# Patient Record
Sex: Female | Born: 1995 | Hispanic: Yes | Marital: Married | Smoking: Never smoker
Health system: Southern US, Community
[De-identification: ages and names within clinical notes are randomized; demographics above are authoritative.]

## PROBLEM LIST (undated history)

## (undated) ENCOUNTER — Inpatient Hospital Stay (HOSPITAL_COMMUNITY): Payer: Self-pay

## (undated) DIAGNOSIS — B009 Herpesviral infection, unspecified: Secondary | ICD-10-CM

## (undated) DIAGNOSIS — Z789 Other specified health status: Secondary | ICD-10-CM

## (undated) DIAGNOSIS — D649 Anemia, unspecified: Secondary | ICD-10-CM

## (undated) HISTORY — PX: NO PAST SURGERIES: SHX2092

---
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
## 2013-06-03 ENCOUNTER — Encounter (HOSPITAL_COMMUNITY): Payer: Self-pay | Admitting: Emergency Medicine

## 2013-06-03 ENCOUNTER — Emergency Department (INDEPENDENT_AMBULATORY_CARE_PROVIDER_SITE_OTHER)
Admission: EM | Admit: 2013-06-03 | Discharge: 2013-06-03 | Disposition: A | Discharge status: Home / Self Care | Payer: Medicaid Other | Source: Home / Self Care

## 2013-06-03 DIAGNOSIS — N926 Irregular menstruation, unspecified: Secondary | ICD-10-CM

## 2013-06-03 DIAGNOSIS — N921 Excessive and frequent menstruation with irregular cycle: Secondary | ICD-10-CM

## 2013-06-03 LAB — POCT URINALYSIS DIP (DEVICE)
BILIRUBIN URINE: NEGATIVE
GLUCOSE, UA: NEGATIVE mg/dL
Ketones, ur: NEGATIVE mg/dL
LEUKOCYTES UA: NEGATIVE
NITRITE: NEGATIVE
Protein, ur: 100 mg/dL — AB
Specific Gravity, Urine: 1.025 (ref 1.005–1.030)
UROBILINOGEN UA: 0.2 mg/dL (ref 0.0–1.0)
pH: 6 (ref 5.0–8.0)

## 2013-06-03 LAB — POCT PREGNANCY, URINE: PREG TEST UR: NEGATIVE

## 2013-06-03 MED ORDER — NORETHINDRONE-ETH ESTRADIOL 1-35 MG-MCG PO TABS: ORAL_TABLET | ORAL | Status: DC

## 2013-06-03 NOTE — ED Provider Notes (Signed)
Medical screening examination/treatment/procedure(s) were performed by resident physician or non-physician practitioner and as supervising physician I was immediately available for consultation/collaboration.   Timithy Arons DOUGLAS MD.   Jb Dulworth D Leiland Mihelich, MD 06/03/13 2056 

## 2013-06-03 NOTE — ED Provider Notes (Signed)
CSN: [id number]     Arrival date & time 06/03/13  1331 History   First MD Initiated Contact with Patient 06/03/13 1517     Chief Complaint  Patient presents with  . Vaginal Bleeding   (Consider location/radiation/quality/duration/timing/severity/associated sxs/prior Treatment) HPI Comments: 18 year old female is complaining of irregular menses for 9 years. Since the onset of menarche she has had irregular menses and dysmenorrhea. She comes in today to the urgent care because she has been bleeding for the past 10 days. She states she has never been regular and often will skip a month or 2 months before it returns. Her usual flow averages 4 days sometimes longer. Other than dysmenorrhea she denies pelvic pain. No fever or vomiting.   Past Medical History  Diagnosis Date  . Medical history non-contributory    Past Surgical History  Procedure Laterality Date  . No past surgeries    . Wound exploration Right 09/05/2012    Procedure: WOUND EXPLORATION OF RIGHT WRIST;  Surgeon: Sharma CovertFred W Ortmann, MD;  Location: [facility] OR;  Service: Orthopedics;  Laterality: Right;  . Tendon repair Right 09/05/2012    Procedure: NERVE AND TENDON REPAIR;  Surgeon: Sharma CovertFred W Ortmann, MD;  Location: [facility] OR;  Service: Orthopedics;  Laterality: Right;   History reviewed. No pertinent family history. History  Substance Use Topics  . Smoking status: Never Smoker   . Smokeless tobacco: Not on file  . Alcohol Use: No   OB History   Grav Para Term Preterm Abortions TAB SAB Ect Mult Living                 Review of Systems  Constitutional: Negative.   Respiratory: Negative.   Gastrointestinal: Negative.   Genitourinary: Positive for vaginal bleeding and menstrual problem. Negative for dysuria, vaginal discharge and vaginal pain.  Neurological: Negative.     Allergies  Review of patient's allergies indicates no known allergies.  Home Medications   Current Outpatient Rx  Name  Route  Sig  Dispense  Refill  .  norethindrone-ethinyl estradiol 1/35 (ORTHO-NOVUM 1/35, 28,) tablet      One tab bid for 10 days then 1 q d   1 Package   0    BP 120/70  Pulse 72  Temp(Src) 98.6 F (37 C) (Oral)  Resp 18  SpO2 100%  LMP 05/24/2013 Physical Exam  Nursing note and vitals reviewed. Constitutional: She is oriented to person, place, and time. She appears well-developed and well-nourished. No distress.  Eyes: Conjunctivae and EOM are normal.  Neck: Normal range of motion. Neck supple.  Cardiovascular: Normal rate and regular rhythm.   Pulmonary/Chest: Effort normal and breath sounds normal. No respiratory distress.  Abdominal: Soft. Bowel sounds are normal. She exhibits no distension and no mass. There is no tenderness. There is no rebound and no guarding.  Neurological: She is alert and oriented to person, place, and time. She exhibits normal muscle tone.  Skin: Skin is warm and dry.  Psychiatric: She has a normal mood and affect.    ED Course  Procedures (including critical care time) Labs Review Labs Reviewed  POCT URINALYSIS DIP (DEVICE) - Abnormal; Notable for the following:    Hgb urine dipstick MODERATE (*)    Protein, ur 100 (*)    All other components within normal limits  POCT PREGNANCY, URINE   Imaging Review No results found.    MDM   1. Metrorrhagia   2. Irregular menses    Must followup with PCP for  continuation of evaluation and management. Today we will start Ortho-Novum 1/35 one twice a day for 10 days and one daily.    Hayden Rasmussen, NP 06/03/13 [phone]

## 2013-06-03 NOTE — ED Notes (Signed)
Pt reports  irreg  And  Prolonged  Vaginal  Bleeding      -  Pt  denys  Any pain            Ambulated  With a  Slow  Steady  Gait     Appears  In no  Severe  Distress

## 2013-06-03 NOTE — Discharge Instructions (Signed)
Abnormal Uterine Bleeding Abnormal uterine bleeding can affect women at various stages in life, including teenagers, women in their reproductive years, pregnant women, and women who have reached menopause. Several kinds of uterine bleeding are considered abnormal, including:  Bleeding or spotting between periods.   Bleeding after sexual intercourse.   Bleeding that is heavier or more than normal.   Periods that last longer than usual.  Bleeding after menopause.  Many cases of abnormal uterine bleeding are minor and simple to treat, while others are more serious. Any type of abnormal bleeding should be evaluated by your health care provider. Treatment will depend on the cause of the bleeding. HOME CARE INSTRUCTIONS Monitor your condition for any changes. The following actions may help to alleviate any discomfort you are experiencing:  Avoid the use of tampons and douches as directed by your health care provider.  Change your pads frequently. You should get regular pelvic exams and Pap tests. Keep all follow-up appointments for diagnostic tests as directed by your health care provider.  SEEK MEDICAL CARE IF:   Your bleeding lasts more than 1 week.   You feel dizzy at times.  SEEK IMMEDIATE MEDICAL CARE IF:   You pass out.   You are changing pads every 15 to 30 minutes.   You have abdominal pain.  You have a fever.   You become sweaty or weak.   You are passing large blood clots from the vagina.   You start to feel nauseous and vomit. MAKE SURE YOU:   Understand these instructions.  Will watch your condition.  Will get help right away if you are not doing well or get worse. Document Released: 04/18/2005 Document Revised: 12/19/2012 Document Reviewed: 11/15/2012 [facility] Patient Information 2014 [place], [place].  Menstruation Menstruation is the monthly passing of blood, tissue, fluid and mucus, also know as a period. Your body is shedding the lining of the  uterus. The flow, or amount of blood, usually lasts from 3 7 days each month. Hormones control the menstrual cycle. Hormones are a chemical substance produced by endocrine glands in the body to regulate different bodily functions. The first menstrual period may start any time between age 18 years to 16 years. However, it usually starts around age 35 years. Some girls have regular monthly menstrual cycles right from the beginning. However, it is not unusual to have only a couple of drops of blood or spotting when you first start menstruating. It is also not unusual to have two periods a month or miss a month or two when first starting your periods. SYMPTOMS   Mild to moderate abdominal cramps.  Aching or pain in the lower back area. Symptoms may occur 5 10 days before your menstrual period starts. These symptoms are referred to as premenstrual syndrome (PMS). These symptoms can include:  Headache.  Breast tenderness and swelling.  Bloating.  Tiredness (fatigue).  Mood changes.  Craving for certain foods. These are normal signs and symptoms and can vary in severity. To help relieve these problems, ask your caregiver if you can take over-the-counter medications for pain or discomfort. If the symptoms are not controllable, see your caregiver for help.  HORMONES INVOLVED IN MENSTRUATION Menstruation comes about because of hormones produced by the pituitary gland in the brain and the ovaries that affect the uterine lining. First, the pituitary gland in the brain produces the hormone follicle stimulating hormone [facility]). FSH stimulates the ovaries to produce estrogen, which thickens the uterine lining and begins to develop an  egg in the ovary. About 14 days later, the pituitary gland produces another hormone called luteinizing hormone (LH). LH causes the egg to come out of a sac in the ovary (ovulation). The empty sac on the ovary called the corpus luteum is stimulated by another hormone from the  pituitary gland called luteotropin. The corpus luteum begins to produce the estrogen and progesterone hormone. The progesterone hormone prepares the lining of the uterus to have the fertilized egg (egg combined with sperm) attach to the lining of the uterus and begin to develop into a fetus. If the egg is not fertilized, the corpus luteum stops producing estrogen and progesterone, it disappears, the lining of the uterus sloughs off and a menstrual period begins. Then the menstrual cycle starts all over again and will continue monthly unless pregnancy occurs or menopause begins. The secretion of hormones is complex. Various parts of the body become involved in many chemical activities. Female sex hormones have other functions in a woman's body as well. Estrogen increases a woman's sex drive (libido). It naturally helps body get rid of fluids (diuretic). It also aids in the process of building new bone. Therefore, maintaining hormonal health is essential to all levels of a woman's well being. These hormones are usually present in normal amounts and cause you to menstruate. It is the relationship between the (small) levels of the hormones that is critical. When the balance is upset, menstrual irregularities can occur. HOW DOES THE MENSTRUAL CYCLE HAPPEN?  Menstrual cycles vary in length from 21 35 days with an average of 29 days. The cycle begins on the first day of bleeding. At this time, the pituitary gland in the brain releases FSH that travels through the bloodstream to the ovaries. [facility] stimulates the follicles in the ovaries. This prepares the body for ovulation that occurs around the 14th day of the cycle. The ovaries produce estrogen, and this makes sure conditions are right in the uterus for implantation of the fertilized egg.  When the levels of estrogen reach a high enough level, it signals the gland in the brain (pituitary gland) to release a surge of LH. This causes the release of the ripest egg  from its follicle (ovulation). Usually only one follicle releases one egg, but sometimes more than one follicle releases an egg especially when stimulating the ovaries for in vitro fertilization. The egg can then be collected by either fallopian tube to await fertilization. The burst follicle within the ovary that is left behind is now called the corpus luteum or "yellow body." The corpus luteum continues to give off (secrete) reduced amounts of estrogen. This closes and hardens the cervix. It dries up the mucus to the naturally infertile condition.  The corpus luteum also begins to give off greater amounts of progesterone. This causes the lining of the uterus (endometrium) to thicken even more in preparation for the fertilized egg. The egg is starting to journey down from the fallopian tube to the uterus. It also signals the ovaries to stop releasing eggs. It assists in returning the cervical mucus to its infertile state.  If the egg implants successfully into the womb lining and pregnancy occurs, progesterone levels will continue to raise. It is often this hormone that gives some pregnant women a feeling of well being, like a "natural high." Progesterone levels drop again after childbirth.  If fertilization does not occur, the corpus luteum dies, stopping the production of hormones. This sudden drop in progesterone causes the uterine lining to break  down, accompanied by blood (menstruation).  This starts the cycle back at day 1. The whole process starts all over again. Woman go through this cycle every month from puberty to menopause. Women have breaks only for pregnancy and breastfeeding (lactation), unless the woman has health problems that affect the female hormone system or chooses to use oral contraceptives to have unnatural menstrual periods. HOME CARE INSTRUCTIONS   Keep track of your periods by using a calendar.  If you use tampons, get the least absorbent to avoid toxic shock syndrome.  Do  not leave tampons in the vagina over night or longer than 6 hours.  Wear a sanitary pad over night.  Exercise 3 5 times a week or more.  Avoid foods and drinks that you know will make your symptoms worse before or during your period. SEEK MEDICAL CARE IF:   You develop a fever with your period.  Your periods are lasting more than 7 days.  Your period is so heavy that you have to change pads or tampons every 30 minutes.  You develop clots with your period and never had clots before.  You cannot get relief from over-the-counter medication for your symptoms.  Your period has not started, and it has been longer than 35 days. Document Released: 04/08/2002 Document Revised: 02/06/2013 Document Reviewed: 11/15/2012 [facility] Patient Information 2014 [place], [place].  Metrorrhagia Metrorrhagia is bleeding from your uterus. It happens at times when you are not expecting it, like between periods. The bleeding may also last a long time. HOME CARE   Take all medicine as told by your doctor. Do not change or switch medicines without talking to your doctor first.  Take iron pills (supplements) as told by your doctor. If you start to have trouble pooping (bowel movement), eat more fruits and vegetables.  Do not take aspirin before or during your period. Do not take medicines that have aspirin in them. Check the label.  Rest as much as you can if you change a pad or tampon more than once in 2 hours.  Eat meals that include foods that have iron in them. These foods are:  Green leafy vegetables.  Red meat.  Liver.  Eggs.  Whole-grain breads and cereals.  Do not try to lose weight until your doctor says it is okay. GET HELP RIGHT AWAY IF:  You have a fever.  You have chills.  You feel lightheaded or pass out (faint).  You need to change your pad or tampon more than once in 1 hour.  Your bleeding is heavy.  You pass clumps of blood (clots) or tissue from your vagina.  You  feel sick to your stomach (nauseous) and throw up (vomit).  You cannot keep foods down.  You feel dizzy or have watery poop (diarrhea) while taking medicine.  You have problems that you think are caused by your medicines. MAKE SURE YOU:   Understand these instructions.  Will watch your condition.  Will get help right away if you are not doing well or get worse. Document Released: 07/11/2011 Document Reviewed: 07/11/2011 [facility] Patient Information 2014 [place], [place].

## 2013-08-27 ENCOUNTER — Emergency Department: Payer: Self-pay | Admitting: Emergency Medicine

## 2013-08-27 LAB — COMPREHENSIVE METABOLIC PANEL
ALBUMIN: 3.8 g/dL (ref 3.8–5.6)
ALK PHOS: 66 U/L
ALT: 21 U/L (ref 12–78)
BUN: 10 mg/dL (ref 7–18)
Bilirubin,Total: 0.4 mg/dL (ref 0.2–1.0)
Calcium, Total: 9.3 mg/dL (ref 9.0–10.7)
Chloride: 105 mmol/L (ref 98–107)
Co2: 25 mmol/L (ref 21–32)
Creatinine: 0.72 mg/dL (ref 0.60–1.30)
Glucose: 95 mg/dL (ref 65–99)
Potassium: 3.5 mmol/L (ref 3.5–5.1)
SGOT(AST): 14 U/L (ref 0–26)
SODIUM: 139 mmol/L (ref 136–145)
TOTAL PROTEIN: 7.6 g/dL (ref 6.4–8.6)

## 2013-08-27 LAB — ACETAMINOPHEN LEVEL

## 2013-08-27 LAB — URINALYSIS, COMPLETE
BACTERIA: NONE SEEN
GLUCOSE, UR: NEGATIVE mg/dL (ref 0–75)
KETONE: NEGATIVE
LEUKOCYTE ESTERASE: NEGATIVE
NITRITE: NEGATIVE
PH: 5 (ref 4.5–8.0)
Protein: 30
SPECIFIC GRAVITY: 1.03 (ref 1.003–1.030)
Squamous Epithelial: <1
WBC UR: 2 /HPF (ref 0–5)

## 2013-08-27 LAB — CBC
HCT: 34 % — AB (ref 35.0–47.0)
HGB: 11.2 g/dL — ABNORMAL LOW (ref 12.0–16.0)
MCH: 27.5 pg (ref 26.0–34.0)
MCHC: 32.9 g/dL (ref 32.0–36.0)
MCV: 84 fL (ref 80–100)
Platelet: 319 10*3/uL (ref 150–440)
RBC: 4.06 10*6/uL (ref 3.80–5.20)
RDW: 13.5 % (ref 11.5–14.5)
WBC: 15.4 10*3/uL — ABNORMAL HIGH (ref 3.6–11.0)

## 2013-08-27 LAB — DRUG SCREEN, URINE
Amphetamines, Ur Screen: NEGATIVE (ref ?–1000)
Barbiturates, Ur Screen: NEGATIVE (ref ?–200)
Benzodiazepine, Ur Scrn: NEGATIVE (ref ?–200)
COCAINE METABOLITE, UR ~~LOC~~: NEGATIVE (ref ?–300)
Cannabinoid 50 Ng, Ur ~~LOC~~: NEGATIVE (ref ?–50)
MDMA (ECSTASY) UR SCREEN: NEGATIVE (ref ?–500)
METHADONE, UR SCREEN: NEGATIVE (ref ?–300)
Opiate, Ur Screen: NEGATIVE (ref ?–300)
Phencyclidine (PCP) Ur S: NEGATIVE (ref ?–25)
TRICYCLIC, UR SCREEN: NEGATIVE (ref ?–1000)

## 2013-08-27 LAB — ETHANOL

## 2013-08-27 LAB — PREGNANCY, URINE: Pregnancy Test, Urine: NEGATIVE m[IU]/mL

## 2013-08-27 LAB — SALICYLATE LEVEL: Salicylates, Serum: <1.7 mg/dL

## 2015-02-06 ENCOUNTER — Ambulatory Visit (INDEPENDENT_AMBULATORY_CARE_PROVIDER_SITE_OTHER): Payer: Medicaid Other | Admitting: Internal Medicine

## 2015-02-06 DIAGNOSIS — Z789 Other specified health status: Secondary | ICD-10-CM

## 2015-02-06 DIAGNOSIS — Z23 Encounter for immunization: Secondary | ICD-10-CM | POA: Diagnosis not present

## 2015-02-06 DIAGNOSIS — Z7189 Other specified counseling: Secondary | ICD-10-CM | POA: Diagnosis present

## 2015-02-06 DIAGNOSIS — Z7184 Encounter for health counseling related to travel: Secondary | ICD-10-CM | POA: Insufficient documentation

## 2015-02-06 MED ORDER — AZITHROMYCIN 500 MG PO TABS: 1000.0000 mg | ORAL_TABLET | Freq: Once | ORAL | Status: DC

## 2015-02-06 MED ORDER — ATOVAQUONE-PROGUANIL HCL 250-100 MG PO TABS: 1.0000 | ORAL_TABLET | Freq: Every day | ORAL | Status: DC

## 2015-02-06 NOTE — Progress Notes (Signed)
Patient will come back 11/7 for Bexsero #2 and Typhim Vi injections.  She will return in 6 months for Hepatitis A #2 injection.  This will complete her series. Andree Coss, RN

## 2015-02-06 NOTE — Progress Notes (Signed)
Patient ID: Bridget Bray, female   DOB: [birth year], 19 y.o.   MRN: [id number] Subjective:   Bridget Bray is a 19 y.o. female who presents to the Infectious Disease clinic for travel consultation. Planned departure date: May 19, 2015          Planned return date: 4 months Countries of travel: none, none, [place], [place], [place], [place] and [place], [place] and [place] and [place] Areas in country: on a ship, semester at sea with port stops for several days   Accommodations: hotel and ship Purpose of travel: foreign study Prior travel out of [place]: yes     Objective:   Medications:reviewed    Assessment:    No contraindications to travel. none     Plan:    Issues discussed: altitude illness, insect-borne illnesses, Japanese encephalitis, malaria, MVA safety, rabies, safe food/water, traveler's diarrhea, website/handouts for more information, what to do if ill upon return, what to do if ill while there and Yellow Fever. Immunizations recommended: Hepatitis A series, Meningococcus, Typhoid (parenteral) and Yellow Fever. Malaria prophylaxis: malarone, daily dose starting 1-2 days before entering endemic area, ending 7 days after leaving area Traveler's diarrhea prophylaxis: azithromycin. Total duration of visit: 1 Hour. Total time spent on education, counseling, coordination of care: 30 Minutes Reviewed travel itinerary.

## 2015-03-09 ENCOUNTER — Ambulatory Visit: Payer: Medicaid Other

## 2015-04-08 ENCOUNTER — Ambulatory Visit: Payer: Medicaid Other

## 2016-04-18 ENCOUNTER — Encounter (HOSPITAL_COMMUNITY): Payer: Self-pay | Admitting: *Deleted

## 2017-06-21 ENCOUNTER — Encounter: Payer: Self-pay | Admitting: *Deleted

## 2017-06-21 ENCOUNTER — Ambulatory Visit (INDEPENDENT_AMBULATORY_CARE_PROVIDER_SITE_OTHER): Payer: Self-pay | Admitting: *Deleted

## 2017-06-21 DIAGNOSIS — Z3201 Encounter for pregnancy test, result positive: Secondary | ICD-10-CM

## 2017-06-21 DIAGNOSIS — Z3200 Encounter for pregnancy test, result unknown: Secondary | ICD-10-CM

## 2017-06-21 LAB — POCT PREGNANCY, URINE: Preg Test, Ur: POSITIVE — AB

## 2017-06-21 NOTE — Progress Notes (Signed)
Here for urine pregnancy test which was positive. States LMP was 05/13/17. Which makes her [id number]. And EDD 02/17/18.  Not taking any meds now. Encouraged  To start prenatal care asap. Given letter of proof of pregnancy.

## 2017-07-25 ENCOUNTER — Ambulatory Visit (INDEPENDENT_AMBULATORY_CARE_PROVIDER_SITE_OTHER): Payer: BLUE CROSS/BLUE SHIELD | Admitting: Certified Nurse Midwife

## 2017-07-25 ENCOUNTER — Encounter: Payer: Self-pay | Admitting: Certified Nurse Midwife

## 2017-07-25 VITALS — BP 108/78 | HR 90 | Wt 142.0 lb

## 2017-07-25 DIAGNOSIS — Z3400 Encounter for supervision of normal first pregnancy, unspecified trimester: Secondary | ICD-10-CM | POA: Diagnosis not present

## 2017-07-25 DIAGNOSIS — Z124 Encounter for screening for malignant neoplasm of cervix: Secondary | ICD-10-CM

## 2017-07-25 DIAGNOSIS — Z113 Encounter for screening for infections with a predominantly sexual mode of transmission: Secondary | ICD-10-CM | POA: Diagnosis not present

## 2017-07-25 DIAGNOSIS — Z1151 Encounter for screening for human papillomavirus (HPV): Secondary | ICD-10-CM

## 2017-07-25 DIAGNOSIS — Z3401 Encounter for supervision of normal first pregnancy, first trimester: Secondary | ICD-10-CM

## 2017-07-25 DIAGNOSIS — Z3481 Encounter for supervision of other normal pregnancy, first trimester: Secondary | ICD-10-CM | POA: Diagnosis not present

## 2017-07-25 NOTE — Progress Notes (Signed)
Subjective:   Bridget Bray is a 22 y.o. G1P0 at 7686w3d by LMP being seen today for her first obstetrical visit.  Her obstetrical history is significant for none, married. Patient does intend to breast feed. Pregnancy history fully reviewed.  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 Occasional vomiting, occasional nausea is getting better: declined medications today.   Patient reports nausea, no bleeding, no contractions, no cramping and no leaking.  HISTORY: OB History  Gravida Para Term Preterm AB Living  1 0 0 0 0 0  SAB TAB Ectopic Multiple Live Births  0 0 0 0 0    # Outcome Date GA Lbr Len/2nd Weight Sex Delivery Anes PTL Lv  1 Current             Last pap smear was done unknown   Past Medical History:  Diagnosis Date  . Medical history non-contributory    Past Surgical History:  Procedure Laterality Date  . NO PAST SURGERIES    . TENDON REPAIR Right 09/05/2012   Procedure: NERVE AND TENDON REPAIR;  Surgeon: Sharma CovertFred W Ortmann, MD;  Location: [facility] OR;  Service: Orthopedics;  Laterality: Right;  . WOUND EXPLORATION Right 09/05/2012   Procedure: WOUND EXPLORATION OF RIGHT WRIST;  Surgeon: Sharma CovertFred W Ortmann, MD;  Location: [facility] OR;  Service: Orthopedics;  Laterality: Right;   History reviewed. No pertinent family history. Social History   Tobacco Use  . Smoking status: Never Smoker  . Smokeless tobacco: Never Used  Substance Use Topics  . Alcohol use: No  . Drug use: No   No Known Allergies No current outpatient medications on file prior to visit.   No current facility-administered medications on file prior to visit.     Review of Systems Pertinent items noted in HPI and remainder of comprehensive ROS otherwise negative.  Exam   Vitals:   07/25/17 1029  BP: 108/78  Pulse: 90  Weight: 142 lb (64.4 kg)   Fetal Heart Rate (bpm): 181; doppler  Uterus:     Pelvic Exam: Perineum: no hemorrhoids, normal perineum   Vulva: normal external genitalia, no lesions   Vagina:  normal mucosa, normal discharge   Cervix: no lesions and normal, pap smear done.    Adnexa: normal adnexa and no mass, fullness, tenderness   Bony Pelvis: average  System: General: well-developed, well-nourished female in no acute distress   Breast:  normal appearance, no masses or tenderness   Skin: normal coloration and turgor, no rashes   Neurologic: oriented, normal, negative, normal mood   Extremities: normal strength, tone, and muscle mass, ROM of all joints is normal   HEENT PERRLA, extraocular movement intact and sclera clear, anicteric   Mouth/Teeth mucous membranes moist, pharynx normal without lesions and dental hygiene good   Neck supple and no masses   Cardiovascular: regular rate and rhythm   Respiratory:  no respiratory distress, normal breath sounds   Abdomen: soft, non-tender; bowel sounds normal; no masses,  no organomegaly     Assessment:   Pregnancy: G1P0 Patient Active Problem List   Diagnosis Date Noted  . Supervision of normal first pregnancy, antepartum 07/25/2017  . Travel advice encounter 02/06/2015     Plan:  1. Supervision of normal first pregnancy, antepartum      Doing well - VITAMIN D 25 Hydroxy (Vit-D Deficiency, Fractures) - Culture, OB Urine - Obstetric Panel, Including HIV - Hemoglobin A1c - Cytology - PAP - Cervicovaginal ancillary only - Genetic Screening -  Inheritest Core(CF97,SMA,FraX)   Initial labs drawn. Continue prenatal vitamins. Genetic Screening discussed, NIPS: ordered. Ultrasound discussed; fetal anatomic survey: ordered. Problem list reviewed and updated. The nature of [place] with multiple MDs and other Advanced Practice Providers was explained to patient; also emphasized that residents, students are part of our team. Routine obstetric precautions reviewed. Return in about 1 month (around 08/22/2017) for ROB.     Orvilla Cornwall, CNM [facility] [employer]

## 2017-07-26 LAB — CERVICOVAGINAL ANCILLARY ONLY
Bacterial vaginitis: POSITIVE — AB
Candida vaginitis: POSITIVE — AB
Chlamydia: NEGATIVE
Neisseria Gonorrhea: NEGATIVE
Trichomonas: NEGATIVE

## 2017-07-26 LAB — OBSTETRIC PANEL, INCLUDING HIV
Antibody Screen: NEGATIVE
Basophils Absolute: 0 10*3/uL (ref 0.0–0.2)
Basos: 1 %
EOS (ABSOLUTE): 0.1 10*3/uL (ref 0.0–0.4)
Eos: 1 %
HIV Screen 4th Generation wRfx: NONREACTIVE
Hematocrit: 40.5 % (ref 34.0–46.6)
Hemoglobin: 13.6 g/dL (ref 11.1–15.9)
Hepatitis B Surface Ag: NEGATIVE
Immature Grans (Abs): 0 10*3/uL (ref 0.0–0.1)
Immature Granulocytes: 0 %
Lymphocytes Absolute: 2.7 10*3/uL (ref 0.7–3.1)
Lymphs: 31 %
MCH: 29.9 pg (ref 26.6–33.0)
MCHC: 33.6 g/dL (ref 31.5–35.7)
MCV: 89 fL (ref 79–97)
Monocytes Absolute: 0.5 10*3/uL (ref 0.1–0.9)
Monocytes: 5 %
Neutrophils Absolute: 5.4 10*3/uL (ref 1.4–7.0)
Neutrophils: 62 %
Platelets: 271 10*3/uL (ref 150–379)
RBC: 4.55 x10E6/uL (ref 3.77–5.28)
RDW: 13.1 % (ref 12.3–15.4)
RPR Ser Ql: NONREACTIVE
Rh Factor: POSITIVE
Rubella Antibodies, IGG: <0.9 index — ABNORMAL LOW (ref >0.99)
WBC: 8.7 10*3/uL (ref 3.4–10.8)

## 2017-07-26 LAB — HEMOGLOBIN A1C
Est. average glucose Bld gHb Est-mCnc: 97 mg/dL
Hgb A1c MFr Bld: 5 % (ref 4.8–5.6)

## 2017-07-26 LAB — VITAMIN D 25 HYDROXY (VIT D DEFICIENCY, FRACTURES): Vit D, 25-Hydroxy: 18.5 ng/mL — ABNORMAL LOW (ref 30.0–100.0)

## 2017-07-27 LAB — CYTOLOGY - PAP: Diagnosis: NEGATIVE

## 2017-07-27 LAB — CULTURE, OB URINE

## 2017-07-27 LAB — URINE CULTURE, OB REFLEX

## 2017-08-01 ENCOUNTER — Other Ambulatory Visit: Payer: Self-pay | Admitting: Certified Nurse Midwife

## 2017-08-01 DIAGNOSIS — E559 Vitamin D deficiency, unspecified: Secondary | ICD-10-CM | POA: Insufficient documentation

## 2017-08-01 DIAGNOSIS — Z2839 Other underimmunization status: Secondary | ICD-10-CM

## 2017-08-01 DIAGNOSIS — N760 Acute vaginitis: Principal | ICD-10-CM

## 2017-08-01 DIAGNOSIS — O9989 Other specified diseases and conditions complicating pregnancy, childbirth and the puerperium: Secondary | ICD-10-CM

## 2017-08-01 DIAGNOSIS — B373 Candidiasis of vulva and vagina: Secondary | ICD-10-CM

## 2017-08-01 DIAGNOSIS — B3731 Acute candidiasis of vulva and vagina: Secondary | ICD-10-CM

## 2017-08-01 DIAGNOSIS — Z3400 Encounter for supervision of normal first pregnancy, unspecified trimester: Secondary | ICD-10-CM

## 2017-08-01 DIAGNOSIS — B9689 Other specified bacterial agents as the cause of diseases classified elsewhere: Secondary | ICD-10-CM

## 2017-08-01 DIAGNOSIS — Z283 Underimmunization status: Secondary | ICD-10-CM

## 2017-08-01 MED ORDER — METRONIDAZOLE 0.75 % VA GEL: 1.0000 | Freq: Two times a day (BID) | VAGINAL | 0 refills | Status: DC

## 2017-08-01 MED ORDER — TERCONAZOLE 0.8 % VA CREA: 1.0000 | TOPICAL_CREAM | Freq: Every day | VAGINAL | 0 refills | Status: DC

## 2017-08-03 ENCOUNTER — Other Ambulatory Visit: Payer: Self-pay | Admitting: Certified Nurse Midwife

## 2017-08-03 DIAGNOSIS — Z3400 Encounter for supervision of normal first pregnancy, unspecified trimester: Secondary | ICD-10-CM

## 2017-08-03 LAB — INHERITEST CORE(CF97,SMA,FRAX)

## 2017-08-22 ENCOUNTER — Encounter (HOSPITAL_COMMUNITY): Payer: Self-pay | Admitting: *Deleted

## 2017-08-22 ENCOUNTER — Other Ambulatory Visit: Payer: Self-pay

## 2017-08-22 ENCOUNTER — Inpatient Hospital Stay (HOSPITAL_COMMUNITY)
Admission: AD | Admit: 2017-08-22 | Discharge: 2017-08-22 | Disposition: A | Discharge status: Home / Self Care | Payer: BLUE CROSS/BLUE SHIELD | Source: Ambulatory Visit | Attending: Obstetrics & Gynecology | Admitting: Obstetrics & Gynecology

## 2017-08-22 ENCOUNTER — Encounter: Payer: BLUE CROSS/BLUE SHIELD | Admitting: Certified Nurse Midwife

## 2017-08-22 DIAGNOSIS — R109 Unspecified abdominal pain: Secondary | ICD-10-CM | POA: Diagnosis not present

## 2017-08-22 DIAGNOSIS — T1490XA Injury, unspecified, initial encounter: Secondary | ICD-10-CM | POA: Diagnosis not present

## 2017-08-22 DIAGNOSIS — Z3A14 14 weeks gestation of pregnancy: Secondary | ICD-10-CM

## 2017-08-22 DIAGNOSIS — O26892 Other specified pregnancy related conditions, second trimester: Secondary | ICD-10-CM

## 2017-08-22 DIAGNOSIS — O9A212 Injury, poisoning and certain other consequences of external causes complicating pregnancy, second trimester: Secondary | ICD-10-CM | POA: Diagnosis not present

## 2017-08-22 DIAGNOSIS — Z3400 Encounter for supervision of normal first pregnancy, unspecified trimester: Secondary | ICD-10-CM

## 2017-08-22 DIAGNOSIS — Y92410 Unspecified street and highway as the place of occurrence of the external cause: Secondary | ICD-10-CM | POA: Insufficient documentation
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
## 2017-08-22 NOTE — MAU Note (Signed)
Urine in lab 

## 2017-08-22 NOTE — Discharge Instructions (Signed)
Preventing Injuries During Pregnancy Injuries can happen during pregnancy. Minor falls and accidents usually do not harm you or your baby. But some injuries can harm you and your baby. Tell your doctor about any injury you suffer. What can I do to avoid injuries? Safety  Remove rugs and loose objects on the floor.  Wear comfortable shoes that have a good grip. Do not wear shoes that have high heels.  Always wear your seat belt in the car. The lap belt should be below your belly. Always drive safely.  Do not ride on a motorcycle. Activity  Do not take part in rough and violent activities or sports.  Avoid: ? Walking on wet or slippery floors. ? Lifting heavy pots of boiling or hot liquids. ? Fixing electrical problems. ? Being near fires. General instructions  Take over-the-counter and prescription medicines only as told by your doctor.  Know your blood type and the blood type of the baby's father.  If you are a victim of domestic violence: ? Call your local emergency services (911 in the [place] ? Contact the National Domestic Violence Hotline for help and support. Get help right away if:  You fall on your belly or receive any serious blow to your belly.  You have a stiff neck or neck pain after a fall or an injury.  You get a headache or have problems with vision after an injury.  You do not feel the baby move or the baby is not moving as much as normal.  You have been a victim of domestic violence or any other kind of attack.  You have been in a car accident.  You have bleeding from your vagina.  Fluid is leaking from your vagina.  You start to have cramping or pain in your belly (contractions).  You have very bad pain in your lower back.  You feel weak or pass out (faint).  You start to throw up (vomit) after an injury.  You have been burned. Summary  Some injuries that happen during pregnancy can do harm to the baby.  Tell your doctor about any  injury.  Take steps to avoid injury. This includes removing rugs and loose objects on the floor. Always wear your seat belt in the car.  Do not take part in rough and violent activities or sports.  Get help right away if you have any serious accident or injury. This information is not intended to replace advice given to you by your health care provider. Make sure you discuss any questions you have with your health care provider. Document Released: 05/21/2010 Document Revised: 04/27/2016 Document Reviewed: 04/27/2016 Elsevier Interactive Patient Education  2017 Elsevier Inc.  

## 2017-08-23 ENCOUNTER — Encounter: Payer: Self-pay | Admitting: Certified Nurse Midwife

## 2017-08-23 ENCOUNTER — Ambulatory Visit (INDEPENDENT_AMBULATORY_CARE_PROVIDER_SITE_OTHER): Payer: BLUE CROSS/BLUE SHIELD | Admitting: Certified Nurse Midwife

## 2017-08-23 VITALS — Wt 139.6 lb

## 2017-08-23 DIAGNOSIS — O9989 Other specified diseases and conditions complicating pregnancy, childbirth and the puerperium: Secondary | ICD-10-CM

## 2017-08-23 DIAGNOSIS — E559 Vitamin D deficiency, unspecified: Secondary | ICD-10-CM

## 2017-08-23 DIAGNOSIS — Z3400 Encounter for supervision of normal first pregnancy, unspecified trimester: Secondary | ICD-10-CM

## 2017-08-23 DIAGNOSIS — Z2839 Other underimmunization status: Secondary | ICD-10-CM

## 2017-08-23 DIAGNOSIS — Z283 Underimmunization status: Secondary | ICD-10-CM

## 2017-08-23 MED ORDER — VITAMIN D (ERGOCALCIFEROL) 1.25 MG (50000 UNIT) PO CAPS: 50000.0000 [IU] | ORAL_CAPSULE | ORAL | 2 refills | Status: DC

## 2017-08-23 NOTE — Progress Notes (Signed)
Patient states that she has not started to feel fetal movement, feels some pressure, denies bleeding.

## 2017-08-23 NOTE — Progress Notes (Signed)
   PRENATAL VISIT NOTE  Subjective:  Bridget Bray is a 22 y.o. G1P0 at 17w4dbeing seen today for ongoing prenatal care.  She is currently monitored for the following issues for this low-risk pregnancy and has Supervision of normal first pregnancy, antepartum; Rubella non-immune status, antepartum; and Vitamin D deficiency on their problem list.  Patient reports no complaints.  Contractions: Not present. Vag. Bleeding: None.   . Denies leaking of fluid.   The following portions of the patient's history were reviewed and updated as appropriate: allergies, current medications, past family history, past medical history, past social history, past surgical history and problem list. Problem list updated.  Objective:   Vitals:   08/23/17 1530  Weight: 139 lb 9.6 oz (63.3 kg)    Fetal Status: Fetal Heart Rate (bpm): 154; doppler         General:  Alert, oriented and cooperative. Patient is in no acute distress.  Skin: Skin is warm and dry. No rash noted.   Cardiovascular: Normal heart rate noted  Respiratory: Normal respiratory effort, no problems with respiration noted  Abdomen: Soft, gravid, appropriate for gestational age.  Pain/Pressure: Present     Pelvic: Cervical exam deferred        Extremities: Normal range of motion.  Edema: None  Mental Status: Normal mood and affect. Normal behavior. Normal judgment and thought content.   Assessment and Plan:  Pregnancy: G1P0 at 115w4d1. Supervision of normal first pregnancy, antepartum     Doing well. Fetal anatomy scan scheduled.   2. Vitamin D deficiency       - Vitamin D, Ergocalciferol, (DRISDOL) 50000 units CAPS capsule; Take 1 capsule (50,000 Units total) by mouth every 7 (seven) days.  Dispense: 30 capsule; Refill: 2  3. Rubella non-immune status, antepartum     MMR postpartum discussed.  Preterm labor symptoms and general obstetric precautions including but not limited to vaginal bleeding, contractions, leaking of fluid  and fetal movement were reviewed in detail with the patient. Please refer to After Visit Summary for other counseling recommendations.  Return in about 1 month (around 09/20/2017) for [place] Future Appointments  Date Time Provider DeCarnegie5/24/2019 10:45 AM [facility] [place] Regino Fournet, CNM

## 2017-09-11 ENCOUNTER — Other Ambulatory Visit: Payer: Self-pay | Admitting: Certified Nurse Midwife

## 2017-09-11 DIAGNOSIS — Z3400 Encounter for supervision of normal first pregnancy, unspecified trimester: Secondary | ICD-10-CM

## 2017-09-20 ENCOUNTER — Encounter: Payer: BLUE CROSS/BLUE SHIELD | Admitting: Certified Nurse Midwife

## 2017-09-22 ENCOUNTER — Ambulatory Visit (HOSPITAL_COMMUNITY)
Admission: RE | Admit: 2017-09-22 | Discharge: 2017-09-22 | Disposition: A | Discharge status: Home / Self Care | Payer: BLUE CROSS/BLUE SHIELD | Source: Ambulatory Visit | Attending: Certified Nurse Midwife | Admitting: Certified Nurse Midwife

## 2017-09-22 ENCOUNTER — Encounter: Payer: Self-pay | Admitting: Certified Nurse Midwife

## 2017-09-22 ENCOUNTER — Other Ambulatory Visit: Payer: Self-pay | Admitting: Certified Nurse Midwife

## 2017-09-22 ENCOUNTER — Ambulatory Visit (INDEPENDENT_AMBULATORY_CARE_PROVIDER_SITE_OTHER): Payer: BLUE CROSS/BLUE SHIELD | Admitting: Certified Nurse Midwife

## 2017-09-22 VITALS — BP 97/71 | HR 88 | Wt 145.0 lb

## 2017-09-22 DIAGNOSIS — Z3A18 18 weeks gestation of pregnancy: Secondary | ICD-10-CM | POA: Insufficient documentation

## 2017-09-22 DIAGNOSIS — E559 Vitamin D deficiency, unspecified: Secondary | ICD-10-CM

## 2017-09-22 DIAGNOSIS — O09899 Supervision of other high risk pregnancies, unspecified trimester: Secondary | ICD-10-CM

## 2017-09-22 DIAGNOSIS — Z3400 Encounter for supervision of normal first pregnancy, unspecified trimester: Secondary | ICD-10-CM

## 2017-09-22 DIAGNOSIS — Z363 Encounter for antenatal screening for malformations: Secondary | ICD-10-CM

## 2017-09-22 DIAGNOSIS — Z283 Underimmunization status: Secondary | ICD-10-CM

## 2017-09-22 DIAGNOSIS — Z3402 Encounter for supervision of normal first pregnancy, second trimester: Secondary | ICD-10-CM

## 2017-09-22 DIAGNOSIS — O9989 Other specified diseases and conditions complicating pregnancy, childbirth and the puerperium: Secondary | ICD-10-CM

## 2017-09-22 NOTE — Progress Notes (Signed)
   PRENATAL VISIT NOTE  Subjective:  Bridget Bray is a 22 y.o. G1P0 at 29w6dbeing seen today for ongoing prenatal care.  She is currently monitored for the following issues for this low-risk pregnancy and has Supervision of normal first pregnancy, antepartum; Rubella non-immune status, antepartum; and Vitamin D deficiency on their problem list.  Patient reports no complaints.  Contractions: Not present. Vag. Bleeding: None.   . Denies leaking of fluid.   The following portions of the patient's history were reviewed and updated as appropriate: allergies, current medications, past family history, past medical history, past social history, past surgical history and problem list. Problem list updated.  Objective:   Vitals:   09/22/17 0824  BP: 97/71  Pulse: 88  Weight: 145 lb (65.8 kg)    Fetal Status: Fetal Heart Rate (bpm): 158; doppler         General:  Alert, oriented and cooperative. Patient is in no acute distress.  Skin: Skin is warm and dry. No rash noted.   Cardiovascular: Normal heart rate noted  Respiratory: Normal respiratory effort, no problems with respiration noted  Abdomen: Soft, gravid, appropriate for gestational age.  Pain/Pressure: Absent     Pelvic: Cervical exam deferred        Extremities: Normal range of motion.     Mental Status: Normal mood and affect. Normal behavior. Normal judgment and thought content.   Assessment and Plan:  Pregnancy: G1P0 at 159w6d1. Supervision of normal first pregnancy, antepartum     Doing well. Normal pregnancy discomforts. Has fetal anatomy scan today.  - AFP, Serum, Open Spina Bifida  2. Vitamin D deficiency     Taking weekly vitamin D.  3. Rubella non-immune status, antepartum     MMR postpartum  Preterm labor symptoms and general obstetric precautions including but not limited to vaginal bleeding, contractions, leaking of fluid and fetal movement were reviewed in detail with the patient. Please refer to After  Visit Summary for other counseling recommendations.  Return in about 1 month (around 10/20/2017) for [place] Future Appointments  Date Time Provider DeCortland5/24/2019 10:45 AM [facility] [place] Lauryl Seyer, CNM

## 2017-09-26 ENCOUNTER — Other Ambulatory Visit: Payer: Self-pay | Admitting: Certified Nurse Midwife

## 2017-09-26 DIAGNOSIS — Z3400 Encounter for supervision of normal first pregnancy, unspecified trimester: Secondary | ICD-10-CM

## 2017-09-27 LAB — AFP, SERUM, OPEN SPINA BIFIDA
AFP MoM: 1.94
AFP Value: 94.7 ng/mL
Gest. Age on Collection Date: 18.9 weeks
Maternal Age At EDD: 24 yr
OSBR Risk 1 IN: 922
Test Results:: NEGATIVE
Weight: 145 [lb_av]

## 2017-09-28 ENCOUNTER — Other Ambulatory Visit: Payer: Self-pay | Admitting: Certified Nurse Midwife

## 2017-09-28 DIAGNOSIS — Z3400 Encounter for supervision of normal first pregnancy, unspecified trimester: Secondary | ICD-10-CM

## 2017-10-20 ENCOUNTER — Encounter: Payer: Self-pay | Admitting: Certified Nurse Midwife

## 2017-10-20 ENCOUNTER — Ambulatory Visit (INDEPENDENT_AMBULATORY_CARE_PROVIDER_SITE_OTHER): Payer: BLUE CROSS/BLUE SHIELD | Admitting: Certified Nurse Midwife

## 2017-10-20 VITALS — BP 93/67 | HR 111 | Wt 156.0 lb

## 2017-10-20 DIAGNOSIS — E559 Vitamin D deficiency, unspecified: Secondary | ICD-10-CM

## 2017-10-20 DIAGNOSIS — O09899 Supervision of other high risk pregnancies, unspecified trimester: Secondary | ICD-10-CM

## 2017-10-20 DIAGNOSIS — O9989 Other specified diseases and conditions complicating pregnancy, childbirth and the puerperium: Secondary | ICD-10-CM

## 2017-10-20 DIAGNOSIS — Z3402 Encounter for supervision of normal first pregnancy, second trimester: Secondary | ICD-10-CM

## 2017-10-20 DIAGNOSIS — Z3400 Encounter for supervision of normal first pregnancy, unspecified trimester: Secondary | ICD-10-CM

## 2017-10-20 DIAGNOSIS — Z283 Underimmunization status: Secondary | ICD-10-CM

## 2017-10-20 NOTE — Progress Notes (Signed)
   PRENATAL VISIT NOTE  Subjective:  Bridget Bray is a 22 y.o. G1P0 at 66w6dbeing seen today for ongoing prenatal care.  She is currently monitored for the following issues for this low-risk pregnancy and has Supervision of normal first pregnancy, antepartum; Rubella non-immune status, antepartum; and Vitamin D deficiency on their problem list.  Patient reports no complaints.  Contractions: Not present. Vag. Bleeding: None.  Movement: Present. Denies leaking of fluid.   The following portions of the patient's history were reviewed and updated as appropriate: allergies, current medications, past family history, past medical history, past social history, past surgical history and problem list. Problem list updated.  Objective:   Vitals:   10/20/17 0817  BP: 93/67  Pulse: (!) 111  Weight: 156 lb (70.8 kg)    Fetal Status: Fetal Heart Rate (bpm): 150; doppler Fundal Height: 22 cm Movement: Present     General:  Alert, oriented and cooperative. Patient is in no acute distress.  Skin: Skin is warm and dry. No rash noted.   Cardiovascular: Normal heart rate noted  Respiratory: Normal respiratory effort, no problems with respiration noted  Abdomen: Soft, gravid, appropriate for gestational age.  Pain/Pressure: Absent     Pelvic: Cervical exam deferred        Extremities: Normal range of motion.     Mental Status: Normal mood and affect. Normal behavior. Normal judgment and thought content.   Assessment and Plan:  Pregnancy: G1P0 at 261w6d1. Supervision of normal first pregnancy, antepartum     Doing well  2. Vitamin D deficiency     Taking weekly vitamin D  3. Rubella non-immune status, antepartum     MMR postpartum  Preterm labor symptoms and general obstetric precautions including but not limited to vaginal bleeding, contractions, leaking of fluid and fetal movement were reviewed in detail with the patient. Please refer to After Visit Summary for other counseling  recommendations.  Return in about 1 month (around 11/17/2017) for [place] Future Appointments  Date Time Provider DeAlex7/17/2019  8:30 AM Bur[place]on, TeRona RavensNP [place]    RaMorene CrockerCNM

## 2017-10-23 ENCOUNTER — Telehealth: Payer: BLUE CROSS/BLUE SHIELD | Admitting: Family Medicine

## 2017-10-23 ENCOUNTER — Encounter: Payer: Self-pay | Admitting: Nurse Practitioner

## 2017-10-23 DIAGNOSIS — N898 Other specified noninflammatory disorders of vagina: Secondary | ICD-10-CM

## 2017-10-23 NOTE — Progress Notes (Signed)
Based on what you shared with me it looks like you have a condition that should be evaluated in a face to face office visit.  NOTE: If you entered your credit card information for this eVisit, you will not be charged. You may see a "hold" on your card for the $30 but that hold will drop off and you will not have a charge processed.  If you are having a true medical emergency please call 911.  If you need an urgent face to face visit, [facility] has four urgent care centers for your convenience.  If you need care fast and have a high deductible or no insurance consider:   [email or web address] to reserve your spot online an avoid wait times  [facility] [address], [address] [place], [place] [address] 8 am to 8 pm Monday-Friday 10 am to 4 pm Saturday-Sunday *Across the street from [employer]  [facility]  [address] [place], [address] 8 am to 5 pm Monday-Friday * In the [facility]   The following sites will take your  insurance:  . [facility]  [phone] Get Driving Directions Find a Provider at this Location  [address] [place], [place] [address] . 10 am to 8 pm Monday-Friday . 12 pm to 8 pm Saturday-Sunday   . [facility]  [phone] Get Driving Directions Find a Provider at this Location  [address], [address] [place], [place] [address] . 8 am to 8 pm Monday-Friday . 9 am to 6 pm Saturday . 11 am to 6 pm Sunday   .  [facility] [place]  [phone] Get Driving Directions  [address] [place], [place] [address] . 8 am to 8 pm Monday-Friday . 8 am to 4 pm Saturday-Sunday   Your e-visit answers were reviewed by a board certified advanced clinical practitioner to complete your personal care plan.  Thank you for using e-Visits. 

## 2017-10-24 ENCOUNTER — Ambulatory Visit (INDEPENDENT_AMBULATORY_CARE_PROVIDER_SITE_OTHER): Payer: BLUE CROSS/BLUE SHIELD | Admitting: Certified Nurse Midwife

## 2017-10-24 ENCOUNTER — Encounter: Payer: Self-pay | Admitting: Certified Nurse Midwife

## 2017-10-24 ENCOUNTER — Other Ambulatory Visit (HOSPITAL_COMMUNITY)
Admission: RE | Admit: 2017-10-24 | Discharge: 2017-10-24 | Disposition: A | Discharge status: Home / Self Care | Payer: BLUE CROSS/BLUE SHIELD | Source: Ambulatory Visit | Attending: Certified Nurse Midwife | Admitting: Certified Nurse Midwife

## 2017-10-24 VITALS — BP 105/73 | HR 92 | Wt 151.2 lb

## 2017-10-24 DIAGNOSIS — B3731 Acute candidiasis of vulva and vagina: Secondary | ICD-10-CM

## 2017-10-24 DIAGNOSIS — O23592 Infection of other part of genital tract in pregnancy, second trimester: Secondary | ICD-10-CM | POA: Diagnosis not present

## 2017-10-24 DIAGNOSIS — Z3400 Encounter for supervision of normal first pregnancy, unspecified trimester: Secondary | ICD-10-CM

## 2017-10-24 DIAGNOSIS — B373 Candidiasis of vulva and vagina: Secondary | ICD-10-CM

## 2017-10-24 DIAGNOSIS — Z2839 Other underimmunization status: Secondary | ICD-10-CM

## 2017-10-24 DIAGNOSIS — N760 Acute vaginitis: Secondary | ICD-10-CM

## 2017-10-24 DIAGNOSIS — Z3A23 23 weeks gestation of pregnancy: Secondary | ICD-10-CM | POA: Insufficient documentation

## 2017-10-24 DIAGNOSIS — Z283 Underimmunization status: Secondary | ICD-10-CM

## 2017-10-24 DIAGNOSIS — Z3402 Encounter for supervision of normal first pregnancy, second trimester: Secondary | ICD-10-CM

## 2017-10-24 DIAGNOSIS — E559 Vitamin D deficiency, unspecified: Secondary | ICD-10-CM

## 2017-10-24 DIAGNOSIS — O9989 Other specified diseases and conditions complicating pregnancy, childbirth and the puerperium: Secondary | ICD-10-CM

## 2017-10-24 MED ORDER — TERCONAZOLE 0.8 % VA CREA: 1.0000 | TOPICAL_CREAM | Freq: Every day | VAGINAL | 0 refills | Status: DC

## 2017-10-24 MED ORDER — FLUCONAZOLE 150 MG PO TABS: 150.0000 mg | ORAL_TABLET | Freq: Once | ORAL | 0 refills | Status: AC

## 2017-10-24 NOTE — Progress Notes (Signed)
   PRENATAL VISIT NOTE  Subjective:  Bridget Bray is a 22 y.o. G1P0 at 80w3dbeing seen today for ongoing prenatal care.  She is currently monitored for the following issues for this low-risk pregnancy and has Supervision of normal first pregnancy, antepartum; Rubella non-immune status, antepartum; and Vitamin D deficiency on their problem list.  Patient reports no bleeding, no contractions, no cramping, no leaking and vaginal irritation.  Contractions: Not present. Vag. Bleeding: None.  Movement: Present. Denies leaking of fluid.   The following portions of the patient's history were reviewed and updated as appropriate: allergies, current medications, past family history, past medical history, past social history, past surgical history and problem list. Problem list updated.  Objective:   Vitals:   10/24/17 0812  BP: 105/73  Pulse: 92  Weight: 151 lb 3.2 oz (68.6 kg)    Fetal Status: Fetal Heart Rate (bpm): 151; doppler Fundal Height: 24 cm Movement: Present     General:  Alert, oriented and cooperative. Patient is in no acute distress.  Skin: Skin is warm and dry. No rash noted.   Cardiovascular: Normal heart rate noted  Respiratory: Normal respiratory effort, no problems with respiration noted  Abdomen: Soft, gravid, appropriate for gestational age.  Pain/Pressure: Absent     Pelvic: Cervical exam deferred        Extremities: Normal range of motion.  Edema: None  Mental Status: Normal mood and affect. Normal behavior. Normal judgment and thought content.   Assessment and Plan:  Pregnancy: G1P0 at 223w3d1. Supervision of normal first pregnancy, antepartum      Yeast vaginitis, swab obtained for yeast/bv.  2hour OGTT planned next ROB - Hemoglobinopathy evaluation  2. Vitamin D deficiency     Taking weekly vitamin D  3. Rubella non-immune status, antepartum     MMR postpartum  4. Vaginitis complicating current pregnancy, second trimester      OTC probiotics  recommended  - Cervicovaginal ancillary only  5. Yeast vaginitis     - terconazole (TERAZOL 3) 0.8 % vaginal cream; Place 1 applicator vaginally at bedtime.  Dispense: 20 g; Refill: 0 - fluconazole (DIFLUCAN) 150 MG tablet; Take 1 tablet (150 mg total) by mouth once for 1 dose.  Dispense: 1 tablet; Refill: 0   Preterm labor symptoms and general obstetric precautions including but not limited to vaginal bleeding, contractions, leaking of fluid and fetal movement were reviewed in detail with the patient. Please refer to After Visit Summary for other counseling recommendations.  Return in about 1 month (around 11/21/2017) for ROB, 2 hr OGTT.  Future Appointments  Date Time Provider DeAmherst7/30/2019  8:30 AM [facility] None  11/28/2017  8:50 AM Burleson, TeRona RavensNP [facility]    RaMorene CrockerCNM

## 2017-10-24 NOTE — Patient Instructions (Signed)
Glucose Tolerance Test During Pregnancy The glucose tolerance test (GTT) is a blood test used to determine if you have developed a type of diabetes during pregnancy (gestational diabetes). This is when your body does not properly process sugar (glucose) in the food you eat, resulting in high blood glucose levels. Typically, a GTT is done after you have had a 1-hour glucose test with results that indicate you possibly have gestational diabetes. It may also be done if:  You have a history of giving birth to very large babies or have experienced repeated fetal loss (stillbirth).  You have signs and symptoms of diabetes, such as: ? Changes in your vision. ? Tingling or numbness in your hands or feet. ? Changes in hunger, thirst, and urination not otherwise explained by your pregnancy.  The GTT lasts about 3 hours. You will be given a sugar-water solution to drink at the beginning of the test. You will have blood drawn before you drink the solution and then again 1, 2, and 3 hours after you drink it. You will not be allowed to eat or drink anything else during the test. You must remain at the testing location to make sure that your blood is drawn on time. You should also avoid exercising during the test, because exercise can alter test results. How do I prepare for this test? Eat normally for 3 days prior to the GTT test, including having plenty of carbohydrate-rich foods. Do not eat or drink anything except water during the final 12 hours before the test. In addition, your health care provider may ask you to stop taking certain medicines before the test. What do the results mean? It is your responsibility to obtain your test results. Ask the lab or department performing the test when and how you will get your results. Contact your health care provider to discuss any questions you have about your results. Range of Normal Values Ranges for normal values may vary among different labs and hospitals. You  should always check with your health care provider after having lab work or other tests done to discuss whether your values are considered within normal limits. Normal levels of blood glucose are as follows:  Fasting: less than 105 mg/dL.  1 hour after drinking the solution: less than 190 mg/dL.  2 hours after drinking the solution: less than 165 mg/dL.  3 hours after drinking the solution: less than 145 mg/dL.  Some substances can interfere with GTT results. These may include:  Blood pressure and heart failure medicines, including beta blockers, furosemide, and thiazides.  Anti-inflammatory medicines, including aspirin.  Nicotine.  Some psychiatric medicines.  Meaning of Results Outside Normal Value Ranges GTT test results that are above normal values may indicate a number of health problems, such as:  Gestational diabetes.  Acute stress response.  Cushing syndrome.  Tumors such as pheochromocytoma or glucagonoma.  Long-term kidney problems.  Pancreatitis.  Hyperthyroidism.  Current infection.  Discuss your test results with your health care provider. He or she will use the results to make a diagnosis and determine a treatment plan that is right for you. This information is not intended to replace advice given to you by your health care provider. Make sure you discuss any questions you have with your health care provider. Document Released: 10/18/2011 Document Revised: 09/24/2015 Document Reviewed: 08/23/2013 Elsevier Interactive Patient Education  2018 [employer]  Second Trimester of Pregnancy The second trimester is from week 13 through week 28, month 4 through 6. This is  often the time in pregnancy that you feel your best. Often times, morning sickness has lessened or quit. You may have more energy, and you may get hungry more often. Your unborn baby (fetus) is growing rapidly. At the end of the sixth month, he or she is about 9 inches long and weighs about 1  pounds. You will likely feel the baby move (quickening) between 18 and 20 weeks of pregnancy. Follow these instructions at home:  Avoid all smoking, herbs, and alcohol. Avoid drugs not approved by your doctor.  Do not use any tobacco products, including cigarettes, chewing tobacco, and electronic cigarettes. If you need help quitting, ask your doctor. You may get counseling or other support to help you quit.  Only take medicine as told by your doctor. Some medicines are safe and some are not during pregnancy.  Exercise only as told by your doctor. Stop exercising if you start having cramps.  Eat regular, healthy meals.  Wear a good support bra if your breasts are tender.  Do not use hot tubs, steam rooms, or saunas.  Wear your seat belt when driving.  Avoid raw meat, uncooked cheese, and liter boxes and soil used by cats.  Take your prenatal vitamins.  Take 1500-2000 milligrams of calcium daily starting at the 20th week of pregnancy until you deliver your baby.  Try taking medicine that helps you poop (stool softener) as needed, and if your doctor approves. Eat more fiber by eating fresh fruit, vegetables, and whole grains. Drink enough fluids to keep your pee (urine) clear or pale yellow.  Take warm water baths (sitz baths) to soothe pain or discomfort caused by hemorrhoids. Use hemorrhoid cream if your doctor approves.  If you have puffy, bulging veins (varicose veins), wear support hose. Raise (elevate) your feet for 15 minutes, 3-4 times a day. Limit salt in your diet.  Avoid heavy lifting, wear low heals, and sit up straight.  Rest with your legs raised if you have leg cramps or low back pain.  Visit your dentist if you have not gone during your pregnancy. Use a soft toothbrush to brush your teeth. Be gentle when you floss.  You can have sex (intercourse) unless your doctor tells you not to.  Go to your doctor visits. Get help if:  You feel dizzy.  You have mild  cramps or pressure in your lower belly (abdomen).  You have a nagging pain in your belly area.  You continue to feel sick to your stomach (nauseous), throw up (vomit), or have watery poop (diarrhea).  You have bad smelling fluid coming from your vagina.  You have pain with peeing (urination). Get help right away if:  You have a fever.  You are leaking fluid from your vagina.  You have spotting or bleeding from your vagina.  You have severe belly cramping or pain.  You lose or gain weight rapidly.  You have trouble catching your breath and have chest pain.  You notice sudden or extreme puffiness (swelling) of your face, hands, ankles, feet, or legs.  You have not felt the baby move in over an hour.  You have severe headaches that do not go away with medicine.  You have vision changes. This information is not intended to replace advice given to you by your health care provider. Make sure you discuss any questions you have with your health care provider. Document Released: 07/13/2009 Document Revised: 09/24/2015 Document Reviewed: 06/19/2012 Elsevier Interactive Patient Education  2017 [employer]  Braxton Hicks Contractions Contractions of the uterus can occur throughout pregnancy, but they are not always a sign that you are in labor. You may have practice contractions called Braxton Hicks contractions. These false labor contractions are sometimes confused with true labor. What are Deberah PeltonBraxton Hicks contractions? Braxton Hicks contractions are tightening movements that occur in the muscles of the uterus before labor. Unlike true labor contractions, these contractions do not result in opening (dilation) and thinning of the cervix. Toward the end of pregnancy (32-34 weeks), Braxton Hicks contractions can happen more often and may become stronger. These contractions are sometimes difficult to tell apart from true labor because they can be very uncomfortable. You should not feel  embarrassed if you go to the hospital with false labor. Sometimes, the only way to tell if you are in true labor is for your health care provider to look for changes in the cervix. The health care provider will do a physical exam and may monitor your contractions. If you are not in true labor, the exam should show that your cervix is not dilating and your water has not broken. If there are other health problems associated with your pregnancy, it is completely safe for you to be sent home with false labor. You may continue to have Braxton Hicks contractions until you go into true labor. How to tell the difference between true labor and false labor True labor  Contractions last 30-70 seconds.  Contractions become very regular.  Discomfort is usually felt in the top of the uterus, and it spreads to the lower abdomen and low back.  Contractions do not go away with walking.  Contractions usually become more intense and increase in frequency.  The cervix dilates and gets thinner. False labor  Contractions are usually shorter and not as strong as true labor contractions.  Contractions are usually irregular.  Contractions are often felt in the front of the lower abdomen and in the groin.  Contractions may go away when you walk around or change positions while lying down.  Contractions get weaker and are shorter-lasting as time goes on.  The cervix usually does not dilate or become thin. Follow these instructions at home:  Take over-the-counter and prescription medicines only as told by your health care provider.  Keep up with your usual exercises and follow other instructions from your health care provider.  Eat and drink lightly if you think you are going into labor.  If Braxton Hicks contractions are making you uncomfortable: ? Change your position from lying down or resting to walking, or change from walking to resting. ? Sit and rest in a tub of warm water. ? Drink enough fluid  to keep your urine pale yellow. Dehydration may cause these contractions. ? Do slow and deep breathing several times an hour.  Keep all follow-up prenatal visits as told by your health care provider. This is important. Contact a health care provider if:  You have a fever.  You have continuous pain in your abdomen. Get help right away if:  Your contractions become stronger, more regular, and closer together.  You have fluid leaking or gushing from your vagina.  You pass blood-tinged mucus (bloody show).  You have bleeding from your vagina.  You have low back pain that you never had before.  You feel your baby's head pushing down and causing pelvic pressure.  Your baby is not moving inside you as much as it used to. Summary  Contractions that occur before labor are called  Braxton Hicks contractions, false labor, or practice contractions.  Braxton Hicks contractions are usually shorter, weaker, farther apart, and less regular than true labor contractions. True labor contractions usually become progressively stronger and regular and they become more frequent.  Manage discomfort from [facility] contractions by changing position, resting in a warm bath, drinking plenty of water, or practicing deep breathing. This information is not intended to replace advice given to you by your health care provider. Make sure you discuss any questions you have with your health care provider. Document Released: 09/01/2016 Document Revised: 09/01/2016 Document Reviewed: 09/01/2016 Elsevier Interactive Patient Education  2018 [employer].

## 2017-10-25 ENCOUNTER — Encounter: Payer: Self-pay | Admitting: Certified Nurse Midwife

## 2017-10-25 ENCOUNTER — Other Ambulatory Visit: Payer: Self-pay | Admitting: Certified Nurse Midwife

## 2017-10-25 DIAGNOSIS — B3731 Acute candidiasis of vulva and vagina: Secondary | ICD-10-CM

## 2017-10-25 DIAGNOSIS — B9689 Other specified bacterial agents as the cause of diseases classified elsewhere: Secondary | ICD-10-CM

## 2017-10-25 DIAGNOSIS — B373 Candidiasis of vulva and vagina: Secondary | ICD-10-CM

## 2017-10-25 DIAGNOSIS — N760 Acute vaginitis: Principal | ICD-10-CM

## 2017-10-25 LAB — CERVICOVAGINAL ANCILLARY ONLY
Bacterial vaginitis: POSITIVE — AB
Candida vaginitis: POSITIVE — AB

## 2017-10-25 MED ORDER — SECNIDAZOLE 2 G PO PACK: 1.0000 | PACK | Freq: Once | ORAL | 0 refills | Status: AC

## 2017-10-25 MED ORDER — FLUCONAZOLE 150 MG PO TABS
150.0000 mg | ORAL_TABLET | Freq: Once | ORAL | 0 refills | Status: AC
Start: 2017-10-25 — End: 2017-10-25

## 2017-10-26 LAB — HEMOGLOBINOPATHY EVALUATION
HGB C: 0 %
HGB S: 0 %
HGB VARIANT: 0 %
Hemoglobin A2 Quantitation: 2.5 % (ref 1.8–3.2)
Hemoglobin F Quantitation: 0 % (ref 0.0–2.0)
Hgb A: 97.5 % (ref 96.4–98.8)

## 2017-10-30 ENCOUNTER — Other Ambulatory Visit: Payer: Self-pay

## 2017-10-30 ENCOUNTER — Encounter: Payer: Self-pay | Admitting: Certified Nurse Midwife

## 2017-10-30 DIAGNOSIS — N760 Acute vaginitis: Principal | ICD-10-CM

## 2017-10-30 DIAGNOSIS — B9689 Other specified bacterial agents as the cause of diseases classified elsewhere: Secondary | ICD-10-CM

## 2017-10-30 MED ORDER — METRONIDAZOLE 500 MG PO TABS: 500.0000 mg | ORAL_TABLET | Freq: Two times a day (BID) | ORAL | 0 refills | Status: DC

## 2017-11-07 ENCOUNTER — Other Ambulatory Visit: Payer: Self-pay | Admitting: Certified Nurse Midwife

## 2017-11-07 DIAGNOSIS — Z3400 Encounter for supervision of normal first pregnancy, unspecified trimester: Secondary | ICD-10-CM

## 2017-11-15 ENCOUNTER — Encounter: Payer: BLUE CROSS/BLUE SHIELD | Admitting: Nurse Practitioner

## 2017-11-28 ENCOUNTER — Ambulatory Visit (INDEPENDENT_AMBULATORY_CARE_PROVIDER_SITE_OTHER): Payer: BLUE CROSS/BLUE SHIELD | Admitting: Nurse Practitioner

## 2017-11-28 ENCOUNTER — Other Ambulatory Visit: Payer: BLUE CROSS/BLUE SHIELD

## 2017-11-28 ENCOUNTER — Other Ambulatory Visit: Payer: Self-pay

## 2017-11-28 VITALS — BP 104/79 | HR 102 | Temp 97.8°F | Wt 155.2 lb

## 2017-11-28 DIAGNOSIS — Z3403 Encounter for supervision of normal first pregnancy, third trimester: Secondary | ICD-10-CM

## 2017-11-28 DIAGNOSIS — Z3400 Encounter for supervision of normal first pregnancy, unspecified trimester: Secondary | ICD-10-CM

## 2017-11-28 DIAGNOSIS — K529 Noninfective gastroenteritis and colitis, unspecified: Secondary | ICD-10-CM | POA: Insufficient documentation

## 2017-11-28 DIAGNOSIS — Z23 Encounter for immunization: Secondary | ICD-10-CM

## 2017-11-28 NOTE — Patient Instructions (Signed)

## 2017-11-28 NOTE — Progress Notes (Signed)
    Subjective:  Bridget Bray is a 22 y.o. G1P0 at 5148w3d being seen today for ongoing prenatal care.  She is currently monitored for the following issues for this low-risk pregnancy and has Supervision of normal first pregnancy, antepartum; Rubella non-immune status, antepartum; and Vitamin D deficiency on their problem list.  Patient reports frequent stools.  Contractions: Not present. Vag. Bleeding: None.  Movement: Present. Denies leaking of fluid.   The following portions of the patient's history were reviewed and updated as appropriate: allergies, current medications, past family history, past medical history, past social history, past surgical history and problem list. Problem list updated.  Objective:   Vitals:   11/28/17 0859  BP: 104/79  Pulse: (!) 102  Temp: 97.8 F (36.6 C)  Weight: 155 lb 3.2 oz (70.4 kg)    Fetal Status: Fetal Heart Rate (bpm): 154 Fundal Height: 30 cm Movement: Present     General:  Alert, oriented and cooperative. Patient is in no acute distress.  Skin: Skin is warm and dry. No rash noted.   Cardiovascular: Normal heart rate noted  Respiratory: Normal respiratory effort, no problems with respiration noted  Abdomen: Soft, gravid, appropriate for gestational age. Pain/Pressure: Present     Pelvic:  Cervical exam deferred        Extremities: Normal range of motion.  Edema: None  Mental Status: Normal mood and affect. Normal behavior. Normal judgment and thought content.   Urinalysis:      Assessment and Plan:  Pregnancy: G1P0 at 4048w3d  1. Supervision of normal first pregnancy, antepartum Discussed childbirth options and management of pain in labor.  May want an epidural.  Encouraged her to attend classes. TDAP given today.  - Glucose Tolerance, 2 Hours w/1 Hour - CBC - HIV antibody (with reflex) - RPR  2. Frequent stools No fever, stools are not watery or liauid but soft, but has 8 per day and they have associated abdominal pain.  In  the past 2 weeks she has been awakening to pain and having a stool.  Discussed uterine contractions and does not think she is having contractions.  No vaginal bleeding.  Discussed letting us know if the pain worsens or if she develops fever, nausea or vomiting.  Preterm labor symptoms and general obstetric precautions including but not limited to vaginal bleeding, contractions, leaking of fluid and fetal movement were reviewed in detail with the patient. Please refer to After Visit Summary for other counseling recommendations.  Return in about 2 weeks (around 12/12/2017).  Nolene BernheimERRI BURLESON, RN, MSN, NP-BC Nurse Practitioner, [facility], [facility] 11/28/2017 9:47 AM

## 2017-11-28 NOTE — Progress Notes (Signed)
ROB. Declined TDAP 11/28/17.  C/o cramping pain that feels like diarrhea and chills x 2 weeks. Denies NV, fever.

## 2017-11-29 LAB — CBC
Hematocrit: 34.5 % (ref 34.0–46.6)
Hemoglobin: 11.5 g/dL (ref 11.1–15.9)
MCH: 30.9 pg (ref 26.6–33.0)
MCHC: 33.3 g/dL (ref 31.5–35.7)
MCV: 93 fL (ref 79–97)
Platelets: 228 10*3/uL (ref 150–450)
RBC: 3.72 x10E6/uL — ABNORMAL LOW (ref 3.77–5.28)
RDW: 12.6 % (ref 12.3–15.4)
WBC: 11 10*3/uL — ABNORMAL HIGH (ref 3.4–10.8)

## 2017-11-29 LAB — HIV ANTIBODY (ROUTINE TESTING W REFLEX): HIV Screen 4th Generation wRfx: NONREACTIVE

## 2017-11-29 LAB — GLUCOSE TOLERANCE, 2 HOURS W/ 1HR
Glucose, 1 hour: 133 mg/dL (ref 65–179)
Glucose, 2 hour: 96 mg/dL (ref 65–152)
Glucose, Fasting: 77 mg/dL (ref 65–91)

## 2017-11-29 LAB — RPR: RPR Ser Ql: NONREACTIVE

## 2017-12-05 ENCOUNTER — Encounter: Payer: BLUE CROSS/BLUE SHIELD | Admitting: Certified Nurse Midwife

## 2017-12-11 ENCOUNTER — Ambulatory Visit (INDEPENDENT_AMBULATORY_CARE_PROVIDER_SITE_OTHER): Payer: BLUE CROSS/BLUE SHIELD | Admitting: Nurse Practitioner

## 2017-12-11 VITALS — BP 97/68 | HR 103 | Wt 157.0 lb

## 2017-12-11 DIAGNOSIS — Z3400 Encounter for supervision of normal first pregnancy, unspecified trimester: Secondary | ICD-10-CM

## 2017-12-11 NOTE — Progress Notes (Signed)
    Subjective:  Bridget Bray is a 22 y.o. G1P0 at 4960w2d being seen today for ongoing prenatal care.  She is currently monitored for the following issues for this low-risk pregnancy and has Supervision of normal first pregnancy, antepartum; Rubella non-immune status, antepartum; Vitamin D deficiency; and Frequent stools on their problem list.  Patient reports not as much diarrhea - does alternate between constipation and diarrhea some..  Contractions: Not present. Vag. Bleeding: None.  Movement: Present. Denies leaking of fluid.   The following portions of the patient's history were reviewed and updated as appropriate: allergies, current medications, past family history, past medical history, past social history, past surgical history and problem list. Problem list updated.  Objective:   Vitals:   12/11/17 1615  BP: 97/68  Pulse: (!) 103  Weight: 157 lb (71.2 kg)    Fetal Status: Fetal Heart Rate (bpm): 140 Fundal Height: 32 cm Movement: Present     General:  Alert, oriented and cooperative. Patient is in no acute distress.  Skin: Skin is warm and dry. No rash noted.   Cardiovascular: Normal heart rate noted  Respiratory: Normal respiratory effort, no problems with respiration noted  Abdomen: Soft, gravid, appropriate for gestational age. Pain/Pressure: Absent     Pelvic:  Cervical exam deferred        Extremities: Normal range of motion.  Edema: None  Mental Status: Normal mood and affect. Normal behavior. Normal judgment and thought content.   Urinalysis:      Assessment and Plan:  Pregnancy: G1P0 at 6460w2d  1. Supervision of normal first pregnancy, antepartum Encouraged her to attend childbirth classes.  Discussed upright positioning for labor.  May want to consider epidural for later in labor.   Reviewed contraceptioni - irregular periods but was able to get pregnant quickly with this baby.  Given written info on contraception.  Preterm labor symptoms and general  obstetric precautions including but not limited to vaginal bleeding, contractions, leaking of fluid and fetal movement were reviewed in detail with the patient. Please refer to After Visit Summary for other counseling recommendations.  Return in about 2 weeks (around 12/25/2017).  Nolene BernheimERRI Chen Holzman, RN, MSN, NP-BC Nurse Practitioner, [facility] [employer] [employer], [facility] 12/11/2017 4:45 PM

## 2017-12-25 ENCOUNTER — Ambulatory Visit (INDEPENDENT_AMBULATORY_CARE_PROVIDER_SITE_OTHER): Payer: BLUE CROSS/BLUE SHIELD | Admitting: Medical

## 2017-12-25 VITALS — BP 106/74 | HR 102 | Wt 158.6 lb

## 2017-12-25 DIAGNOSIS — Z3400 Encounter for supervision of normal first pregnancy, unspecified trimester: Secondary | ICD-10-CM

## 2017-12-25 NOTE — Patient Instructions (Signed)
Research childbirth classes and hospital preregistration at [email or web address]  Fetal Movement Counts Patient Name: ________________________________________________ Patient Due Date: ____________________ What is a fetal movement count? A fetal movement count is the number of times that you feel your baby move during a certain amount of time. This may also be called a fetal kick count. A fetal movement count is recommended for every pregnant woman. You may be asked to start counting fetal movements as early as week 28 of your pregnancy. Pay attention to when your baby is most active. You may notice your baby's sleep and wake cycles. You may also notice things that make your baby move more. You should do a fetal movement count:  When your baby is normally most active.  At the same time each day.  A good time to count movements is while you are resting, after having something to eat and drink. How do I count fetal movements? 1. Find a quiet, comfortable area. Sit, or lie down on your side. 2. Write down the date, the start time and stop time, and the number of movements that you felt between those two times. Take this information with you to your health care visits. 3. For 2 hours, count kicks, flutters, swishes, rolls, and jabs. You should feel at least 10 movements during 2 hours. 4. You may stop counting after you have felt 10 movements. 5. If you do not feel 10 movements in 2 hours, have something to eat and drink. Then, keep resting and counting for 1 hour. If you feel at least 4 movements during that hour, you may stop counting. Contact a health care provider if:  You feel fewer than 4 movements in 2 hours.  Your baby is not moving like he or she usually does. Date: ____________ Start time: ____________ Stop time: ____________ Movements: ____________ Date: ____________ Start time: ____________ Stop time: ____________ Movements: ____________ Date: ____________ Start time: ____________  Stop time: ____________ Movements: ____________ Date: ____________ Start time: ____________ Stop time: ____________ Movements: ____________ Date: ____________ Start time: ____________ Stop time: ____________ Movements: ____________ Date: ____________ Start time: ____________ Stop time: ____________ Movements: ____________ Date: ____________ Start time: ____________ Stop time: ____________ Movements: ____________ Date: ____________ Start time: ____________ Stop time: ____________ Movements: ____________ Date: ____________ Start time: ____________ Stop time: ____________ Movements: ____________ This information is not intended to replace advice given to you by your health care provider. Make sure you discuss any questions you have with your health care provider. Document Released: 05/18/2006 Document Revised: 12/16/2015 Document Reviewed: 05/28/2015 Elsevier Interactive Patient Education  2018 [employer]  Braxton Hicks Contractions Contractions of the uterus can occur throughout pregnancy, but they are not always a sign that you are in labor. You may have practice contractions called Braxton Hicks contractions. These false labor contractions are sometimes confused with true labor. What are Braxton Hicks contractions? Braxton Hicks contractions are tightening movements that occur in the muscles of the uterus before labor. Unlike true labor contractions, these contractions do not result in opening (dilation) and thinning of the cervix. Toward the end of pregnancy (32-34 weeks), Braxton Hicks contractions can happen more often and may become stronger. These contractions are sometimes difficult to tell apart from true labor because they can be very uncomfortable. You should not feel embarrassed if you go to the hospital with false labor. Sometimes, the only way to tell if you are in true labor is for your health care provider to look for changes in the cervix. The health care provider will   do a physical  exam and may monitor your contractions. If you are not in true labor, the exam should show that your cervix is not dilating and your water has not broken. If there are other health problems associated with your pregnancy, it is completely safe for you to be sent home with false labor. You may continue to have Braxton Hicks contractions until you go into true labor. How to tell the difference between true labor and false labor True labor  Contractions last 30-70 seconds.  Contractions become very regular.  Discomfort is usually felt in the top of the uterus, and it spreads to the lower abdomen and low back.  Contractions do not go away with walking.  Contractions usually become more intense and increase in frequency.  The cervix dilates and gets thinner. False labor  Contractions are usually shorter and not as strong as true labor contractions.  Contractions are usually irregular.  Contractions are often felt in the front of the lower abdomen and in the groin.  Contractions may go away when you walk around or change positions while lying down.  Contractions get weaker and are shorter-lasting as time goes on.  The cervix usually does not dilate or become thin. Follow these instructions at home:  Take over-the-counter and prescription medicines only as told by your health care provider.  Keep up with your usual exercises and follow other instructions from your health care provider.  Eat and drink lightly if you think you are going into labor.  If Braxton Hicks contractions are making you uncomfortable: ? Change your position from lying down or resting to walking, or change from walking to resting. ? Sit and rest in a tub of warm water. ? Drink enough fluid to keep your urine pale yellow. Dehydration may cause these contractions. ? Do slow and deep breathing several times an hour.  Keep all follow-up prenatal visits as told by your health care provider. This is  important. Contact a health care provider if:  You have a fever.  You have continuous pain in your abdomen. Get help right away if:  Your contractions become stronger, more regular, and closer together.  You have fluid leaking or gushing from your vagina.  You pass blood-tinged mucus (bloody show).  You have bleeding from your vagina.  You have low back pain that you never had before.  You feel your baby's head pushing down and causing pelvic pressure.  Your baby is not moving inside you as much as it used to. Summary  Contractions that occur before labor are called Braxton Hicks contractions, false labor, or practice contractions.  Braxton Hicks contractions are usually shorter, weaker, farther apart, and less regular than true labor contractions. True labor contractions usually become progressively stronger and regular and they become more frequent.  Manage discomfort from Braxton Hicks contractions by changing position, resting in a warm bath, drinking plenty of water, or practicing deep breathing. This information is not intended to replace advice given to you by your health care provider. Make sure you discuss any questions you have with your health care provider. Document Released: 09/01/2016 Document Revised: 09/01/2016 Document Reviewed: 09/01/2016 Elsevier Interactive Patient Education  2018 [employer] Inc.    

## 2017-12-25 NOTE — Progress Notes (Signed)
   PRENATAL VISIT NOTE  Subjective:  Bridget Bray is a 22 y.o. G1P0 at 6952w2d being seen today for ongoing prenatal care.  She is currently monitored for the following issues for this low-risk pregnancy and has Supervision of normal first pregnancy, antepartum; Rubella non-immune status, antepartum; Vitamin D deficiency; and Frequent stools on their problem list.  Patient reports pelvic pain.  Contractions: Not present. Vag. Bleeding: None.  Movement: Present. Denies leaking of fluid.   The following portions of the patient's history were reviewed and updated as appropriate: allergies, current medications, past family history, past medical history, past social history, past surgical history and problem list. Problem list updated.  Objective:   Vitals:   12/25/17 0849  BP: 106/74  Pulse: (!) 102  Weight: 158 lb 9.6 oz (71.9 kg)    Fetal Status: Fetal Heart Rate (bpm): 137 Fundal Height: 33 cm Movement: Present     General:  Alert, oriented and cooperative. Patient is in no acute distress.  Skin: Skin is warm and dry. No rash noted.   Cardiovascular: Normal heart rate noted  Respiratory: Normal respiratory effort, no problems with respiration noted  Abdomen: Soft, gravid, appropriate for gestational age.  Pain/Pressure: Present     Pelvic: Cervical exam deferred        Extremities: Normal range of motion.  Edema: None  Mental Status: Normal mood and affect. Normal behavior. Normal judgment and thought content.   Assessment and Plan:  Pregnancy: G1P0 at 6452w2d  1. Supervision of normal first pregnancy, antepartum - Discussed use of Tylenol PRN for pain and abdominal binder regularly with activity  Preterm labor symptoms and general obstetric precautions including but not limited to vaginal bleeding, contractions, leaking of fluid and fetal movement were reviewed in detail with the patient. Please refer to After Visit Summary for other counseling recommendations.  Return in  about 2 weeks (around 01/08/2018) for LOB.   Vonzella NippleJulie Rigdon Macomber, PA-C

## 2018-01-12 ENCOUNTER — Ambulatory Visit (INDEPENDENT_AMBULATORY_CARE_PROVIDER_SITE_OTHER): Payer: BLUE CROSS/BLUE SHIELD | Admitting: Advanced Practice Midwife

## 2018-01-12 DIAGNOSIS — Z23 Encounter for immunization: Secondary | ICD-10-CM | POA: Diagnosis not present

## 2018-01-12 DIAGNOSIS — Z3403 Encounter for supervision of normal first pregnancy, third trimester: Secondary | ICD-10-CM

## 2018-01-12 DIAGNOSIS — Z3400 Encounter for supervision of normal first pregnancy, unspecified trimester: Secondary | ICD-10-CM

## 2018-01-12 NOTE — Patient Instructions (Signed)
Third Trimester of Pregnancy The third trimester is from week 28 through week 40 (months 7 through 9). The third trimester is a time when the unborn baby (fetus) is growing rapidly. At the end of the ninth month, the fetus is about 20 inches in length and weighs 6-10 pounds. Body changes during your third trimester Your body will continue to go through many changes during pregnancy. The changes vary from woman to woman. During the third trimester:  Your weight will continue to increase. You can expect to gain 25-35 pounds (11-16 kg) by the end of the pregnancy.  You may begin to get stretch marks on your hips, abdomen, and breasts.  You may urinate more often because the fetus is moving lower into your pelvis and pressing on your bladder.  You may develop or continue to have heartburn. This is caused by increased hormones that slow down muscles in the digestive tract.  You may develop or continue to have constipation because increased hormones slow digestion and cause the muscles that push waste through your intestines to relax.  You may develop hemorrhoids. These are swollen veins (varicose veins) in the rectum that can itch or be painful.  You may develop swollen, bulging veins (varicose veins) in your legs.  You may have increased body aches in the pelvis, back, or thighs. This is due to weight gain and increased hormones that are relaxing your joints.  You may have changes in your hair. These can include thickening of your hair, rapid growth, and changes in texture. Some women also have hair loss during or after pregnancy, or hair that feels dry or thin. Your hair will most likely return to normal after your baby is born.  Your breasts will continue to grow and they will continue to become tender. A yellow fluid (colostrum) may leak from your breasts. This is the first milk you are producing for your baby.  Your belly button may stick out.  You may notice more swelling in your hands,  face, or ankles.  You may have increased tingling or numbness in your hands, arms, and legs. The skin on your belly may also feel numb.  You may feel short of breath because of your expanding uterus.  You may have more problems sleeping. This can be caused by the size of your belly, increased need to urinate, and an increase in your body's metabolism.  You may notice the fetus "dropping," or moving lower in your abdomen (lightening).  You may have increased vaginal discharge.  You may notice your joints feel loose and you may have pain around your pelvic bone.  What to expect at prenatal visits You will have prenatal exams every 2 weeks until week 36. Then you will have weekly prenatal exams. During a routine prenatal visit:  You will be weighed to make sure you and the baby are growing normally.  Your blood pressure will be taken.  Your abdomen will be measured to track your baby's growth.  The fetal heartbeat will be listened to.  Any test results from the previous visit will be discussed.  You may have a cervical check near your due date to see if your cervix has softened or thinned (effaced).  You will be tested for Group B streptococcus. This happens between 35 and 37 weeks.  Your health care provider may ask you:  What your birth plan is.  How you are feeling.  If you are feeling the baby move.  If you have had   any abnormal symptoms, such as leaking fluid, bleeding, severe headaches, or abdominal cramping.  If you are using any tobacco products, including cigarettes, chewing tobacco, and electronic cigarettes.  If you have any questions.  Other tests or screenings that may be performed during your third trimester include:  Blood tests that check for low iron levels (anemia).  Fetal testing to check the health, activity level, and growth of the fetus. Testing is done if you have certain medical conditions or if there are problems during the  pregnancy.  Nonstress test (NST). This test checks the health of your baby to make sure there are no signs of problems, such as the baby not getting enough oxygen. During this test, a belt is placed around your belly. The baby is made to move, and its heart rate is monitored during movement.  What is false labor? False labor is a condition in which you feel small, irregular tightenings of the muscles in the womb (contractions) that usually go away with rest, changing position, or drinking water. These are called Braxton Hicks contractions. Contractions may last for hours, days, or even weeks before true labor sets in. If contractions come at regular intervals, become more frequent, increase in intensity, or become painful, you should see your health care provider. What are the signs of labor?  Abdominal cramps.  Regular contractions that start at 10 minutes apart and become stronger and more frequent with time.  Contractions that start on the top of the uterus and spread down to the lower abdomen and back.  Increased pelvic pressure and dull back pain.  A watery or bloody mucus discharge that comes from the vagina.  Leaking of amniotic fluid. This is also known as your "water breaking." It could be a slow trickle or a gush. Let your health care provider know if it has a color or strange odor. If you have any of these signs, call your health care provider right away, even if it is before your due date. Follow these instructions at home: Medicines  Follow your health care provider's instructions regarding medicine use. Specific medicines may be either safe or unsafe to take during pregnancy.  Take a prenatal vitamin that contains at least 600 micrograms (mcg) of folic acid.  If you develop constipation, try taking a stool softener if your health care provider approves. Eating and drinking  Eat a balanced diet that includes fresh fruits and vegetables, whole grains, good sources of protein  such as meat, eggs, or tofu, and low-fat dairy. Your health care provider will help you determine the amount of weight gain that is right for you.  Avoid raw meat and uncooked cheese. These carry germs that can cause birth defects in the baby.  If you have low calcium intake from food, talk to your health care provider about whether you should take a daily calcium supplement.  Eat four or five small meals rather than three large meals a day.  Limit foods that are high in fat and processed sugars, such as fried and sweet foods.  To prevent constipation: ? Drink enough fluid to keep your urine clear or pale yellow. ? Eat foods that are high in fiber, such as fresh fruits and vegetables, whole grains, and beans. Activity  Exercise only as directed by your health care provider. Most women can continue their usual exercise routine during pregnancy. Try to exercise for 30 minutes at least 5 days a week. Stop exercising if you experience uterine contractions.  Avoid heavy   lifting.  Do not exercise in extreme heat or humidity, or at high altitudes.  Wear low-heel, comfortable shoes.  Practice good posture.  You may continue to have sex unless your health care provider tells you otherwise. Relieving pain and discomfort  Take frequent breaks and rest with your legs elevated if you have leg cramps or low back pain.  Take warm sitz baths to soothe any pain or discomfort caused by hemorrhoids. Use hemorrhoid cream if your health care provider approves.  Wear a good support bra to prevent discomfort from breast tenderness.  If you develop varicose veins: ? Wear support pantyhose or compression stockings as told by your healthcare provider. ? Elevate your feet for 15 minutes, 3-4 times a day. Prenatal care  Write down your questions. Take them to your prenatal visits.  Keep all your prenatal visits as told by your health care provider. This is important. Safety  Wear your seat belt at  all times when driving.  Make a list of emergency phone numbers, including numbers for family, friends, the hospital, and police and fire departments. General instructions  Avoid cat litter boxes and soil used by cats. These carry germs that can cause birth defects in the baby. If you have a cat, ask someone to clean the litter box for you.  Do not travel far distances unless it is absolutely necessary and only with the approval of your health care provider.  Do not use hot tubs, steam rooms, or saunas.  Do not drink alcohol.  Do not use any products that contain nicotine or tobacco, such as cigarettes and e-cigarettes. If you need help quitting, ask your health care provider.  Do not use any medicinal herbs or unprescribed drugs. These chemicals affect the formation and growth of the baby.  Do not douche or use tampons or scented sanitary pads.  Do not cross your legs for long periods of time.  To prepare for the arrival of your baby: ? Take prenatal classes to understand, practice, and ask questions about labor and delivery. ? Make a trial run to the hospital. ? Visit the hospital and tour the maternity area. ? Arrange for maternity or paternity leave through employers. ? Arrange for family and friends to take care of pets while you are in the hospital. ? Purchase a rear-facing car seat and make sure you know how to install it in your car. ? Pack your hospital bag. ? Prepare the baby's nursery. Make sure to remove all pillows and stuffed animals from the baby's crib to prevent suffocation.  Visit your dentist if you have not gone during your pregnancy. Use a soft toothbrush to brush your teeth and be gentle when you floss. Contact a health care provider if:  You are unsure if you are in labor or if your water has broken.  You become dizzy.  You have mild pelvic cramps, pelvic pressure, or nagging pain in your abdominal area.  You have lower back pain.  You have persistent  nausea, vomiting, or diarrhea.  You have an unusual or bad smelling vaginal discharge.  You have pain when you urinate. Get help right away if:  Your water breaks before 37 weeks.  You have regular contractions less than 5 minutes apart before 37 weeks.  You have a fever.  You are leaking fluid from your vagina.  You have spotting or bleeding from your vagina.  You have severe abdominal pain or cramping.  You have rapid weight loss or weight gain.    You have shortness of breath with chest pain.  You notice sudden or extreme swelling of your face, hands, ankles, feet, or legs.  Your baby makes fewer than 10 movements in 2 hours.  You have severe headaches that do not go away when you take medicine.  You have vision changes. Summary  The third trimester is from week 28 through week 40, months 7 through 9. The third trimester is a time when the unborn baby (fetus) is growing rapidly.  During the third trimester, your discomfort may increase as you and your baby continue to gain weight. You may have abdominal, leg, and back pain, sleeping problems, and an increased need to urinate.  During the third trimester your breasts will keep growing and they will continue to become tender. A yellow fluid (colostrum) may leak from your breasts. This is the first milk you are producing for your baby.  False labor is a condition in which you feel small, irregular tightenings of the muscles in the womb (contractions) that eventually go away. These are called Braxton Hicks contractions. Contractions may last for hours, days, or even weeks before true labor sets in.  Signs of labor can include: abdominal cramps; regular contractions that start at 10 minutes apart and become stronger and more frequent with time; watery or bloody mucus discharge that comes from the vagina; increased pelvic pressure and dull back pain; and leaking of amniotic fluid. This information is not intended to replace advice  given to you by your health care provider. Make sure you discuss any questions you have with your health care provider. Document Released: 04/12/2001 Document Revised: 09/24/2015 Document Reviewed: 06/19/2012 Elsevier Interactive Patient Education  2017 [employer]  

## 2018-01-12 NOTE — Progress Notes (Signed)
   PRENATAL VISIT NOTE  Subjective:  Bridget Bray is a 22 y.o. G1P0 at 6823w6d being seen today for ongoing prenatal care.  She is currently monitored for the following issues for this low-risk pregnancy and has Supervision of normal first pregnancy, antepartum; Rubella non-immune status, antepartum; Vitamin D deficiency; and Frequent stools on their problem list.  Patient reports no complaints.  Contractions: Not present. Vag. Bleeding: None.  Movement: Present. Denies leaking of fluid.   The following portions of the patient's history were reviewed and updated as appropriate: allergies, current medications, past family history, past medical history, past social history, past surgical history and problem list. Problem list updated.  Objective:   Vitals:   01/12/18 0853  BP: 102/71  Pulse: (!) 111  Weight: 73 kg    Fetal Status: Fetal Heart Rate (bpm): 150   Movement: Present     General:  Alert, oriented and cooperative. Patient is in no acute distress.  Skin: Skin is warm and dry. No rash noted.   Cardiovascular: Normal heart rate noted  Respiratory: Normal respiratory effort, no problems with respiration noted  Abdomen: Soft, gravid, appropriate for gestational age.  Pain/Pressure: Present     Pelvic: Cervical exam deferred        Extremities: Normal range of motion.     Mental Status: Normal mood and affect. Normal behavior. Normal judgment and thought content.   Assessment and Plan:  Pregnancy: G1P0 at 4523w6d  1. Supervision of normal first pregnancy, antepartum --Anticipatory guidance about next visits/weeks of pregnancy given.  Preterm labor symptoms and general obstetric precautions including but not limited to vaginal bleeding, contractions, leaking of fluid and fetal movement were reviewed in detail with the patient. Please refer to After Visit Summary for other counseling recommendations.  Return in about 2 weeks (around 01/26/2018).  No future  appointments.  Sharen CounterLisa Leftwich-Kirby, CNM

## 2018-01-26 ENCOUNTER — Encounter: Payer: Self-pay | Admitting: Nurse Practitioner

## 2018-01-26 ENCOUNTER — Ambulatory Visit (INDEPENDENT_AMBULATORY_CARE_PROVIDER_SITE_OTHER): Payer: BLUE CROSS/BLUE SHIELD | Admitting: Nurse Practitioner

## 2018-01-26 DIAGNOSIS — Z113 Encounter for screening for infections with a predominantly sexual mode of transmission: Secondary | ICD-10-CM

## 2018-01-26 DIAGNOSIS — Z3400 Encounter for supervision of normal first pregnancy, unspecified trimester: Secondary | ICD-10-CM

## 2018-01-26 LAB — OB RESULTS CONSOLE GC/CHLAMYDIA: GC PROBE AMP, GENITAL: NEGATIVE

## 2018-01-26 NOTE — Progress Notes (Signed)
    Subjective:  Bridget Bray is a 22 y.o. G1P0 at [redacted]w[redacted]d being seen today for ongoing prenatal care.  She is currently monitored for the following issues for this low-risk pregnancy and has Supervision of normal first pregnancy, antepartum; Rubella non-immune status, antepartum; Vitamin D deficiency; and Frequent stools on their problem list.  Patient reports occasional contractions.  Contractions: Irritability. Vag. Bleeding: None.  Movement: Present. Denies leaking of fluid.   The following portions of the patient's history were reviewed and updated as appropriate: allergies, current medications, past family history, past medical history, past social history, past surgical history and problem list. Problem list updated.  Objective:   Vitals:   01/26/18 0824  BP: 101/70  Pulse: 93  Weight: 163 lb (73.9 kg)    Fetal Status: Fetal Heart Rate (bpm): 142 Fundal Height: 35 cm Movement: Present  Presentation: Vertex  General:  Alert, oriented and cooperative. Patient is in no acute distress.  Skin: Skin is warm and dry. No rash noted.   Cardiovascular: Normal heart rate noted  Respiratory: Normal respiratory effort, no problems with respiration noted  Abdomen: Soft, gravid, appropriate for gestational age. Pain/Pressure: Present     Pelvic:  Cervical exam performed Dilation: Closed   Station: -2  Cervix was very posterior and exam was difficult for client.  Encouraged to use slow breathing  Extremities: Normal range of motion.  Edema: None  Mental Status: Normal mood and affect. Normal behavior. Normal judgment and thought content.   Urinalysis:      Assessment and Plan:  Pregnancy: G1P0 at [redacted]w[redacted]d  1. Supervision of normal first pregnancy, antepartum  Term labor symptoms and general obstetric precautions including but not limited to vaginal bleeding, contractions, leaking of fluid and fetal movement were reviewed in detail with the patient. Please refer to After Visit Summary  for other counseling recommendations.  Return in about 1 week (around 02/02/2018).  Nolene Bernheim, RN, MSN, NP-BC Nurse Practitioner, [facility] for [employer], [facility] 01/26/2018 8:43 AM

## 2018-01-26 NOTE — Patient Instructions (Addendum)
Importance of a good latch . Increases milk transfer to baby - baby gets enough milk . Ensures you have enough milk for your baby . Decreases nipple soreness . Don't use pacifiers and bottles - these cause baby to suck differently than breastfeeding.  Use a bottle at 3 weeks if you are going back to work. . Promotes continuation of breastfeeding    Braxton Hicks Contractions Contractions of the uterus can occur throughout pregnancy, but they are not always a sign that you are in labor. You may have practice contractions called Braxton Hicks contractions. These false labor contractions are sometimes confused with true labor. What are Deberah Pelton contractions? Braxton Hicks contractions are tightening movements that occur in the muscles of the uterus before labor. Unlike true labor contractions, these contractions do not result in opening (dilation) and thinning of the cervix. Toward the end of pregnancy (32-34 weeks), Braxton Hicks contractions can happen more often and may become stronger. These contractions are sometimes difficult to tell apart from true labor because they can be very uncomfortable. You should not feel embarrassed if you go to the hospital with false labor. Sometimes, the only way to tell if you are in true labor is for your health care provider to look for changes in the cervix. The health care provider will do a physical exam and may monitor your contractions. If you are not in true labor, the exam should show that your cervix is not dilating and your water has not broken. If there are other health problems associated with your pregnancy, it is completely safe for you to be sent home with false labor. You may continue to have Braxton Hicks contractions until you go into true labor. How to tell the difference between true labor and false labor True labor  Contractions last 30-70 seconds.  Contractions become very regular.  Discomfort is usually felt in the top of the  uterus, and it spreads to the lower abdomen and low back.  Contractions do not go away with walking.  Contractions usually become more intense and increase in frequency.  The cervix dilates and gets thinner. False labor  Contractions are usually shorter and not as strong as true labor contractions.  Contractions are usually irregular.  Contractions are often felt in the front of the lower abdomen and in the groin.  Contractions may go away when you walk around or change positions while lying down.  Contractions get weaker and are shorter-lasting as time goes on.  The cervix usually does not dilate or become thin. Follow these instructions at home:  Take over-the-counter and prescription medicines only as told by your health care provider.  Keep up with your usual exercises and follow other instructions from your health care provider.  Eat and drink lightly if you think you are going into labor.  If Braxton Hicks contractions are making you uncomfortable: ? Change your position from lying down or resting to walking, or change from walking to resting. ? Sit and rest in a tub of warm water. ? Drink enough fluid to keep your urine pale yellow. Dehydration may cause these contractions. ? Do slow and deep breathing several times an hour.  Keep all follow-up prenatal visits as told by your health care provider. This is important. Contact a health care provider if:  You have a fever.  You have continuous pain in your abdomen. Get help right away if:  Your contractions become stronger, more regular, and closer together.  You have fluid leaking  or gushing from your vagina.  You pass blood-tinged mucus (bloody show).  You have bleeding from your vagina.  You have low back pain that you never had before.  You feel your baby's head pushing down and causing pelvic pressure.  Your baby is not moving inside you as much as it used to. Summary  Contractions that occur before  labor are called Braxton Hicks contractions, false labor, or practice contractions.  Braxton Hicks contractions are usually shorter, weaker, farther apart, and less regular than true labor contractions. True labor contractions usually become progressively stronger and regular and they become more frequent.  Manage discomfort from [facility] contractions by changing position, resting in a warm bath, drinking plenty of water, or practicing deep breathing. This information is not intended to replace advice given to you by your health care provider. Make sure you discuss any questions you have with your health care provider. Document Released: 09/01/2016 Document Revised: 09/01/2016 Document Reviewed: 09/01/2016 Elsevier Interactive Patient Education  2018 [employer].

## 2018-01-28 LAB — STREP GP B NAA: Strep Gp B NAA: NEGATIVE

## 2018-01-30 LAB — GC/CHLAMYDIA PROBE AMP (~~LOC~~) NOT AT ARMC
CHLAMYDIA, DNA PROBE: NEGATIVE
Neisseria Gonorrhea: NEGATIVE

## 2018-02-02 ENCOUNTER — Encounter: Payer: Self-pay | Admitting: Obstetrics

## 2018-02-02 ENCOUNTER — Ambulatory Visit (INDEPENDENT_AMBULATORY_CARE_PROVIDER_SITE_OTHER): Payer: BLUE CROSS/BLUE SHIELD | Admitting: Obstetrics

## 2018-02-02 DIAGNOSIS — Z3403 Encounter for supervision of normal first pregnancy, third trimester: Secondary | ICD-10-CM

## 2018-02-02 DIAGNOSIS — Z3400 Encounter for supervision of normal first pregnancy, unspecified trimester: Secondary | ICD-10-CM

## 2018-02-02 NOTE — Progress Notes (Signed)
Subjective:  Bridget Bray is a 22 y.o. G1P0 at [redacted]w[redacted]d being seen today for ongoing prenatal care.  She is currently monitored for the following issues for this low-risk pregnancy and has Supervision of normal first pregnancy, antepartum; Rubella non-immune status, antepartum; Vitamin D deficiency; and Frequent stools on their problem list.  Patient reports sore throat.  Contractions: Not present. Vag. Bleeding: None.  Movement: Present. Denies leaking of fluid.   The following portions of the patient's history were reviewed and updated as appropriate: allergies, current medications, past family history, past medical history, past social history, past surgical history and problem list. Problem list updated.  Objective:   Vitals:   02/02/18 0902  BP: 94/67  Pulse: 93  Temp: 97.9 F (36.6 C)  Weight: 165 lb (74.8 kg)    Fetal Status:     Movement: Present     General:  Alert, oriented and cooperative. Patient is in no acute distress.  Skin: Skin is warm and dry. No rash noted.   Cardiovascular: Normal heart rate noted  Respiratory: Normal respiratory effort, no problems with respiration noted  Abdomen: Soft, gravid, appropriate for gestational age. Pain/Pressure: Present     Pelvic:  Cervical exam deferred        Extremities: Normal range of motion.  Edema: None  Mental Status: Normal mood and affect. Normal behavior. Normal judgment and thought content.   Urinalysis:      Assessment and Plan:  Pregnancy: G1P0 at [redacted]w[redacted]d  1. Supervision of normal first pregnancy, antepartum   Term labor symptoms and general obstetric precautions including but not limited to vaginal bleeding, contractions, leaking of fluid and fetal movement were reviewed in detail with the patient. Please refer to After Visit Summary for other counseling recommendations.  Return in about 1 week (around 02/09/2018) for ROB.   Brock Bad, MD

## 2018-02-02 NOTE — Progress Notes (Signed)
ROB  GBS Negative on 01/26/18.

## 2018-02-05 DIAGNOSIS — Z3481 Encounter for supervision of other normal pregnancy, first trimester: Secondary | ICD-10-CM

## 2018-02-09 ENCOUNTER — Ambulatory Visit (INDEPENDENT_AMBULATORY_CARE_PROVIDER_SITE_OTHER): Payer: BLUE CROSS/BLUE SHIELD | Admitting: Obstetrics

## 2018-02-09 ENCOUNTER — Encounter: Payer: Self-pay | Admitting: Obstetrics

## 2018-02-09 VITALS — BP 104/75 | HR 94 | Wt 164.0 lb

## 2018-02-09 DIAGNOSIS — Z3403 Encounter for supervision of normal first pregnancy, third trimester: Secondary | ICD-10-CM

## 2018-02-09 DIAGNOSIS — Z3400 Encounter for supervision of normal first pregnancy, unspecified trimester: Secondary | ICD-10-CM

## 2018-02-09 NOTE — Progress Notes (Signed)
Subjective:  Bridget Bray is a 22 y.o. G1P0 at [redacted]w[redacted]d being seen today for ongoing prenatal care.  She is currently monitored for the following issues for this low-risk pregnancy and has Supervision of normal first pregnancy, antepartum; Rubella non-immune status, antepartum; Vitamin D deficiency; and Frequent stools on their problem list.  Patient reports no complaints.  Contractions: Not present. Vag. Bleeding: None.  Movement: Present. Denies leaking of fluid.   The following portions of the patient's history were reviewed and updated as appropriate: allergies, current medications, past family history, past medical history, past social history, past surgical history and problem list. Problem list updated.  Objective:   Vitals:   02/09/18 1035  BP: 104/75  Pulse: 94  Weight: 164 lb (74.4 kg)    Fetal Status: Fetal Heart Rate (bpm): 140   Movement: Present     General:  Alert, oriented and cooperative. Patient is in no acute distress.  Skin: Skin is warm and dry. No rash noted.   Cardiovascular: Normal heart rate noted  Respiratory: Normal respiratory effort, no problems with respiration noted  Abdomen: Soft, gravid, appropriate for gestational age. Pain/Pressure: Present     Pelvic:  Cervical exam deferred        Extremities: Normal range of motion.     Mental Status: Normal mood and affect. Normal behavior. Normal judgment and thought content.   Urinalysis:      Assessment and Plan:  Pregnancy: G1P0 at [redacted]w[redacted]d  1. Supervision of normal first pregnancy, antepartum   Term labor symptoms and general obstetric precautions including but not limited to vaginal bleeding, contractions, leaking of fluid and fetal movement were reviewed in detail with the patient. Please refer to After Visit Summary for other counseling recommendations.  Return in about 1 week (around 02/16/2018) for ROB.   Brock Bad, MD

## 2018-02-13 ENCOUNTER — Encounter (HOSPITAL_COMMUNITY): Payer: Self-pay | Admitting: *Deleted

## 2018-02-13 ENCOUNTER — Other Ambulatory Visit: Payer: Self-pay

## 2018-02-13 ENCOUNTER — Inpatient Hospital Stay (HOSPITAL_COMMUNITY)
Admission: AD | Admit: 2018-02-13 | Discharge: 2018-02-17 | DRG: 786 | Disposition: A | Discharge status: Home / Self Care | Payer: BLUE CROSS/BLUE SHIELD | Attending: Family Medicine | Admitting: Family Medicine

## 2018-02-13 DIAGNOSIS — O4292 Full-term premature rupture of membranes, unspecified as to length of time between rupture and onset of labor: Principal | ICD-10-CM | POA: Diagnosis present

## 2018-02-13 DIAGNOSIS — O411230 Chorioamnionitis, third trimester, not applicable or unspecified: Secondary | ICD-10-CM | POA: Diagnosis present

## 2018-02-13 DIAGNOSIS — O4290 Premature rupture of membranes, unspecified as to length of time between rupture and onset of labor, unspecified weeks of gestation: Secondary | ICD-10-CM | POA: Diagnosis present

## 2018-02-13 DIAGNOSIS — Z3A39 39 weeks gestation of pregnancy: Secondary | ICD-10-CM

## 2018-02-13 DIAGNOSIS — O42113 Preterm premature rupture of membranes, onset of labor more than 24 hours following rupture, third trimester: Secondary | ICD-10-CM | POA: Diagnosis not present

## 2018-02-13 LAB — CBC
HEMATOCRIT: 34.3 % — AB (ref 36.0–46.0)
HEMOGLOBIN: 11.8 g/dL — AB (ref 12.0–15.0)
MCH: 31.2 pg (ref 26.0–34.0)
MCHC: 34.4 g/dL (ref 30.0–36.0)
MCV: 90.7 fL (ref 80.0–100.0)
NRBC: 0 % (ref 0.0–0.2)
PLATELETS: 213 10*3/uL (ref 150–400)
RBC: 3.78 MIL/uL — AB (ref 3.87–5.11)
RDW: 13.6 % (ref 11.5–15.5)
WBC: 10.1 10*3/uL (ref 4.0–10.5)

## 2018-02-13 LAB — TYPE AND SCREEN
ABO/RH(D): O POS
Antibody Screen: NEGATIVE

## 2018-02-13 LAB — POCT FERN TEST: POCT FERN TEST: POSITIVE

## 2018-02-13 LAB — ABO/RH: ABO/RH(D): O POS

## 2018-02-13 LAB — RPR: RPR: NONREACTIVE

## 2018-02-13 MED ORDER — LACTATED RINGERS IV SOLN: 500.0000 mL | INTRAVENOUS | Status: DC | PRN

## 2018-02-13 MED ORDER — MORPHINE SULFATE (PF) 10 MG/ML IV SOLN
10.0000 mg | Freq: Once | INTRAVENOUS | Status: AC
  Administered 2018-02-13: 10 mg via INTRAMUSCULAR
  Filled 2018-02-13: qty 1

## 2018-02-13 MED ORDER — ONDANSETRON HCL 4 MG/2ML IJ SOLN: 4.0000 mg | Freq: Four times a day (QID) | INTRAMUSCULAR | Status: DC | PRN

## 2018-02-13 MED ORDER — SOD CITRATE-CITRIC ACID 500-334 MG/5ML PO SOLN
30.0000 mL | ORAL | Status: DC | PRN
  Administered 2018-02-14: 30 mL via ORAL
  Filled 2018-02-13: qty 15

## 2018-02-13 MED ORDER — OXYTOCIN 40 UNITS IN LACTATED RINGERS INFUSION - SIMPLE MED
2.5000 [IU]/h | INTRAVENOUS | Status: DC
  Administered 2018-02-14: 2.5 [IU]/h via INTRAVENOUS

## 2018-02-13 MED ORDER — OXYCODONE-ACETAMINOPHEN 5-325 MG PO TABS: 1.0000 | ORAL_TABLET | ORAL | Status: DC | PRN

## 2018-02-13 MED ORDER — TERBUTALINE SULFATE 1 MG/ML IJ SOLN
0.2500 mg | Freq: Once | INTRAMUSCULAR | Status: AC | PRN
  Administered 2018-02-14: 0.25 mg via SUBCUTANEOUS
  Filled 2018-02-13: qty 1

## 2018-02-13 MED ORDER — MORPHINE SULFATE (PF) 10 MG/ML IV SOLN
6.0000 mg | Freq: Once | INTRAVENOUS | Status: AC | PRN
  Administered 2018-02-13: 6 mg via INTRAVENOUS
  Filled 2018-02-13: qty 1

## 2018-02-13 MED ORDER — OXYCODONE-ACETAMINOPHEN 5-325 MG PO TABS: 2.0000 | ORAL_TABLET | ORAL | Status: DC | PRN

## 2018-02-13 MED ORDER — LACTATED RINGERS IV SOLN
INTRAVENOUS | Status: DC
  Administered 2018-02-13 – 2018-02-14 (×2): via INTRAVENOUS

## 2018-02-13 MED ORDER — LIDOCAINE HCL (PF) 1 % IJ SOLN: 30.0000 mL | INTRAMUSCULAR | Status: DC | PRN

## 2018-02-13 MED ORDER — MORPHINE SULFATE (PF) 4 MG/ML IV SOLN: 5.0000 mg | Freq: Once | INTRAVENOUS | Status: DC | PRN

## 2018-02-13 MED ORDER — ACETAMINOPHEN 325 MG PO TABS: 650.0000 mg | ORAL_TABLET | ORAL | Status: DC | PRN

## 2018-02-13 MED ORDER — OXYTOCIN BOLUS FROM INFUSION: 500.0000 mL | Freq: Once | INTRAVENOUS | Status: DC

## 2018-02-13 MED ORDER — FENTANYL CITRATE (PF) 100 MCG/2ML IJ SOLN
100.0000 ug | INTRAMUSCULAR | Status: DC | PRN
  Administered 2018-02-13 (×2): 100 ug via INTRAVENOUS
  Filled 2018-02-13 (×2): qty 2

## 2018-02-13 MED ORDER — MORPHINE SULFATE (PF) 4 MG/ML IV SOLN
6.0000 mg | Freq: Once | INTRAVENOUS | Status: AC | PRN
  Administered 2018-02-13: 6 mg via INTRAVENOUS
  Filled 2018-02-13: qty 2

## 2018-02-13 MED ORDER — MISOPROSTOL 50MCG HALF TABLET
50.0000 ug | ORAL_TABLET | ORAL | Status: DC
  Administered 2018-02-13 (×2): 50 ug via ORAL
  Filled 2018-02-13 (×2): qty 1

## 2018-02-13 NOTE — Progress Notes (Signed)
OB/GYN Faculty Practice: Labor Progress Note  Subjective: Doing well, not feeling contractions. Still continuing to feel fluid leaking. Husband in room.   Objective: BP 95/64   Pulse 95   Temp 98.7 F (37.1 C) (Oral)   Resp 17   Ht 4\' 11"  (1.499 m)   Wt 75.9 kg   LMP 05/13/2017   BMI 33.81 kg/m  Gen: well-appearing, NAD Dilation: Fingertip Effacement (%): Thick Cervical Position: Posterior Station: Ballotable, -3 Presentation: Vertex Exam by:: Dr Earlene Plater  Assessment and Plan: 22 y.o. G1P0 [redacted]w[redacted]d here for PROM.   Labor: PROM at 0200. Not in labor, not feeling contractions. Expectant management for about 8 hours but discussed risk of infection and patient in agreement with augmentation.  -- cytotec x 1  -- pain control: undecided -- PPH Risk: low   Fetal Well-Being: EFW 7lbs by Leopold's. Cephalic by sutures.  -- Category I - continuous fetal monitoring  -- GBS negative   Bridget Bray S. Earlene Plater, DO OB/GYN Fellow, Faculty Practice  1:07 PM

## 2018-02-13 NOTE — Progress Notes (Signed)
OB/GYN Faculty Practice: Labor Progress Note  Subjective: Strip note. Discussed plan of care with RN. Patient complaining of pain. FB still in place. Requesting additional pain medications - has gotten IM morphine, IV morphine, IV fentanyl. Open to epidural but hoping to wait until more active labor.   Objective: BP 110/61   Pulse 76   Temp 98.7 F (37.1 C) (Oral)   Resp 18   Ht 4\' 11"  (1.499 m)   Wt 75.9 kg   LMP 05/13/2017   SpO2 97%   BMI 33.81 kg/m  Gen: strip note Dilation: 1 Effacement (%): 50 Cervical Position: Middle Station: -3 Presentation: Vertex Exam by:: Dr Earlene Bray  Assessment and Plan: 22 y.o. G1P0 [redacted]w[redacted]d here for PROM.   Labor: PROM at 0200. Contracting very frequently without any additional medication at this time.  -- FB placed 1900  -- cytotec x 2 (1430)  -- pain control: planning for epidural  fentanyl IV, morphine 6mg  IV, morphine 10mg  IM > will trial additional dose of IV morphien  -- PPH Risk: low   Fetal Well-Being: EFW 7lbs by Leopold's. Cephalic by sutures.  -- Category I - continuous fetal monitoring  -- GBS negative   Bridget Madia S. Earlene Plater, DO OB/GYN Fellow, Faculty Practice  11:11 PM

## 2018-02-13 NOTE — Progress Notes (Signed)
LABOR PROGRESS NOTE  Bridget Bray is a 22 y.o. G1P0 at [redacted]w[redacted]d  admitted for IOL d/t PROM.   Subjective: Doing well, no complaints. Family at bedside.   Objective: BP 105/74   Pulse 96   Temp 98.7 F (37.1 C) (Oral)   Resp 17   Ht 4\' 11"  (1.499 m)   Wt 75.9 kg   LMP 05/13/2017   BMI 33.81 kg/m  or  Vitals:   02/13/18 1001 02/13/18 1101 02/13/18 1201 02/13/18 1301  BP: 93/60 100/70 95/64 105/74  Pulse: 89 88 95 96  Resp: 18 16 17    Temp:  98.7 F (37.1 C)    TempSrc:  Oral    Weight:      Height:        Dilation: Fingertip Effacement (%): Thick Cervical Position: Middle Station: -3 Presentation: Vertex Exam by:: Milus Glazier RN' FHT: baseline rate 150, moderate varibility, +acel, -decel Toco: Irregular   Labs: Lab Results  Component Value Date   WBC 10.1 02/13/2018   HGB 11.8 (L) 02/13/2018   HCT 34.3 (L) 02/13/2018   MCV 90.7 02/13/2018   PLT 213 02/13/2018    Patient Active Problem List   Diagnosis Date Noted  . PROM (premature rupture of membranes) 02/13/2018  . Frequent stools 11/28/2017  . Rubella non-immune status, antepartum 08/01/2017  . Vitamin D deficiency 08/01/2017  . Supervision of normal first pregnancy, antepartum 07/25/2017    Assessment / Plan: 22 y.o. G1P0 at [redacted]w[redacted]d here for IOL due to PROM. GBS negative.    Labor: IOL with cytotec. Start 2nd cytotec, will recheck in a few hours. Hesitant to place FB d/t ROM.  Fetal Wellbeing:  Cat 1 strip  Pain Control:  Well controlled currently, wanting to try NO vs IV pain medications during delivery Anticipated MOD:  NSVD   Leticia Penna, D.O. Family Medicine PGY-1  02/13/2018, 2:54 PM

## 2018-02-13 NOTE — MAU Note (Signed)
Pt presents to MAU c/o possible SROM at 0300 clear mucous like fluid. Pt denies any ctx. +FM. No bleeding.

## 2018-02-13 NOTE — H&P (Addendum)
LABOR AND DELIVERY ADMISSION HISTORY AND PHYSICAL NOTE  Bridget Bray is a 22 y.o. female G1P0 with IUP at [redacted]w[redacted]d by LMP presenting for PROM confirmed with positive FERN testing in MAU. Patient reports she first felt fluid leakage around 2AM at home.  She reports positive fetal movement. She denies vaginal bleeding.  She denies HA, RUQ pain, and LE edema. Does reports seeing floaters earlier today while taking a shower but denies current vision changes.   Prenatal History/Complications: PNC at [facility] Pregnancy complications:  - rubella non immune - vitamin D deficiency   Sono: @[redacted]w[redacted]d , CWD, normal female anatomy, cephalic presentation, anterior above cervical os placental lie, 259g, 44% EFW  Past Medical History: Past Medical History:  Diagnosis Date  . Medical history non-contributory     Past Surgical History: Past Surgical History:  Procedure Laterality Date  . NO PAST SURGERIES    . TENDON REPAIR Right 09/05/2012   Procedure: NERVE AND TENDON REPAIR;  Surgeon: Sharma Covert, MD;  Location: [facility] OR;  Service: Orthopedics;  Laterality: Right;  . WOUND EXPLORATION Right 09/05/2012   Procedure: WOUND EXPLORATION OF RIGHT WRIST;  Surgeon: Sharma Covert, MD;  Location: [facility] OR;  Service: Orthopedics;  Laterality: Right;    Obstetrical History: OB History    Gravida  1   Para      Term      Preterm      AB      Living        SAB      TAB      Ectopic      Multiple      Live Births              Social History: Social History   Socioeconomic History  . Marital status: Married    Spouse name: Not on file  . Number of children: Not on file  . Years of education: Not on file  . Highest education level: Not on file  Occupational History  . Not on file  Social Needs  . Financial resource strain: Not on file  . Food insecurity:    Worry: Not on file    Inability: Not on file  . Transportation needs:    Medical: Not on file    Non-medical: Not on  file  Tobacco Use  . Smoking status: Never Smoker  . Smokeless tobacco: Never Used  Substance and Sexual Activity  . Alcohol use: No  . Drug use: No  . Sexual activity: Yes  Lifestyle  . Physical activity:    Days per week: Not on file    Minutes per session: Not on file  . Stress: Not on file  Relationships  . Social connections:    Talks on phone: Not on file    Gets together: Not on file    Attends religious service: Not on file    Active member of club or organization: Not on file    Attends meetings of clubs or organizations: Not on file    Relationship status: Not on file  Other Topics Concern  . Not on file  Social History Narrative   ** Merged History Encounter **       ** Merged History Encounter **        Family History: Family History  Problem Relation Age of Onset  . ADD / ADHD Neg Hx   . Alcohol abuse Neg Hx   . Anxiety disorder Neg Hx   . Arthritis  Neg Hx   . Asthma Neg Hx   . Birth defects Neg Hx   . Cancer Neg Hx   . COPD Neg Hx   . Depression Neg Hx   . Diabetes Neg Hx   . Drug abuse Neg Hx   . Early death Neg Hx   . Hearing loss Neg Hx   . Heart disease Neg Hx   . Hyperlipidemia Neg Hx   . Hypertension Neg Hx   . Kidney disease Neg Hx   . Intellectual disability Neg Hx   . Learning disabilities Neg Hx   . Miscarriages / Stillbirths Neg Hx   . Stroke Neg Hx   . Obesity Neg Hx   . Vision loss Neg Hx   . Varicose Veins Neg Hx     Allergies: No Known Allergies  Medications Prior to Admission  Medication Sig Dispense Refill Last Dose  . Prenatal Vit-Fe Fumarate-FA (PRENATAL MULTIVITAMIN) TABS tablet Take 1 tablet by mouth daily at 12 noon.   02/12/2018 at Unknown time  . Vitamin D, Ergocalciferol, (DRISDOL) 50000 units CAPS capsule Take 1 capsule (50,000 Units total) by mouth every 7 (seven) days. 30 capsule 2 Past Week at Unknown time     Review of Systems  All systems reviewed and negative except as stated in HPI  Physical  Exam Blood pressure 98/65, pulse 78, temperature 97.9 F (36.6 C), temperature source Oral, resp. rate 16, height 4\' 11"  (1.499 m), weight 75.9 kg, last menstrual period 05/13/2017. General appearance: alert, oriented, NAD Lungs: normal respiratory effort Heart: regular rate Abdomen: soft, non-tender; gravid, FH appropriate for GA Extremities: No calf swelling or tenderness Presentation: cephalic per MAU Fetal monitoring: baseline 150, moderate variability, +accel, +1 late decel Uterine activity: irregular  Dilation: Fingertip Effacement (%): Thick Station: -3, Ballotable Exam by:: Everlene Farrier, RN  Prenatal labs: ABO, Rh: --/--/O POS (10/15 1610) Antibody: PENDING (10/15 0452) Rubella: <0.90 (03/26 1149) RPR: Non Reactive (07/30 1045)  HBsAg: Negative (03/26 1149)  HIV: Non Reactive (07/30 1045)  GC/Chlamydia: Negative (01/26/18) GBS: Negative (09/27 0957)  2-hr GTT: 77/133/96 Genetic screening:  Neg AFP Anatomy US: normal female   Prenatal Transfer Tool  Maternal Diabetes: No Genetic Screening: Normal Maternal Ultrasounds/Referrals: Normal Fetal Ultrasounds or other Referrals:  None Maternal Substance Abuse:  No Significant Maternal Medications:  Meds include: Other: Vit D (50,000U q7days) Significant Maternal Lab Results: Lab values include: Group B Strep negative  Results for orders placed or performed during the hospital encounter of 02/13/18 (from the past 24 hour(s))  Fern Test   Collection Time: 02/13/18  3:59 AM  Result Value Ref Range   POCT Fern Test Positive = ruptured amniotic membanes   CBC   Collection Time: 02/13/18  4:52 AM  Result Value Ref Range   WBC 10.1 4.0 - 10.5 K/uL   RBC 3.78 (L) 3.87 - 5.11 MIL/uL   Hemoglobin 11.8 (L) 12.0 - 15.0 g/dL   HCT 96.0 (L) 45.4 - 09.8 %   MCV 90.7 80.0 - 100.0 fL   MCH 31.2 26.0 - 34.0 pg   MCHC 34.4 30.0 - 36.0 g/dL   RDW 11.9 14.7 - 82.9 %   Platelets 213 150 - 400 K/uL   nRBC 0.0 0.0 - 0.2 %  Type and  screen [facility] [place]   Collection Time: 02/13/18  4:52 AM  Result Value Ref Range   ABO/RH(D) O POS    Antibody Screen PENDING    Sample Expiration  02/16/2018 Performed at [facility], [address] [place], [place] [address]     Patient Active Problem List   Diagnosis Date Noted  . PROM (premature rupture of membranes) 02/13/2018  . Frequent stools 11/28/2017  . Rubella non-immune status, antepartum 08/01/2017  . Vitamin D deficiency 08/01/2017  . Supervision of normal first pregnancy, antepartum 07/25/2017    Assessment: Bridget Bray is a 22 y.o. G1P0 at [redacted]w[redacted]d here for PROM  #Labor: expectant management, if no significant cervical change can consider augmentation with cytotec/FB/pitocin  #Pain: Per patient preference, Nitrous oxide and Fentanyl. Would like to try and avoid epidural if she can  #FWB: Cat 2 #ID:  GBS neg #MOF: breast #MOC:undecided  #Circ:  N/A  Oralia Manis, DO PGY-2 02/13/2018, 5:37 AM   OB FELLOW HISTORY AND PHYSICAL ATTESTATION  I have seen and examined this patient; I agree with above documentation in the resident's note.   Marcy Siren, D.O. OB Fellow  02/14/2018, 3:23 PM

## 2018-02-13 NOTE — Anesthesia Pain Management Evaluation Note (Signed)
  CR[facility] Pain Management Visit Note  Patient: Bridget Bray, 22 y.o., female  "Hello I am a member of the anesthesia team at [facility]. We have an anesthesia team available at all times to provide care throughout the hospital, including epidural management and anesthesia for C-section. I don't know your plan for the delivery whether it a natural birth, water birth, IV sedation, nitrous supplementation, doula or epidural, but we want to meet your pain goals."   1.Was your pain managed to your expectations on prior hospitalizations?   No prior hospitalizations  2.What is your expectation for pain management during this hospitalization?     IV pain meds  3.How can we help you reach that goal?   Record the patient's initial score and the patient's pain goal.   Pain: 0  Pain Goal: 7 [facility] wants you to be able to say your pain was always managed very well.  Laban Emperor 02/13/2018

## 2018-02-13 NOTE — MAU Note (Signed)
.  Ms. Bridget Bray is a [redacted]w[redacted]d G1P0 at [redacted]w[redacted]d who presents to MAU today. She denies vaginal bleeding. She endorses LOF since 0300. Her GBS status is negative. She desires epidural.   BP 112/73   Pulse (!) 101   Temp 97.7 F (36.5 C) (Oral)   Ht 4\' 11"  (1.499 m)   Wt 75.8 kg   LMP 05/13/2017   BMI 33.74 kg/m  Dilation: Fingertip Effacement (%): Thick Cervical Position: Posterior Station: -3, Ballotable Presentation: Vertex Exam by:: Everlene Farrier, RN  Report called to on-call MD for admission Verbal orders taken Patient transported to L&D

## 2018-02-13 NOTE — Progress Notes (Signed)
OB/GYN Faculty Practice: Labor Progress Note  Subjective: Doing well, received fentanyl for pain control. Feeling contractions regularly.   Objective: BP (!) 87/46   Pulse 93   Temp 98.7 F (37.1 C) (Oral)   Resp 17   Ht 4\' 11"  (1.499 m)   Wt 75.9 kg   LMP 05/13/2017   BMI 33.81 kg/m  Gen: well-appearing, sleepy  Dilation: 1 Effacement (%): 50 Cervical Position: Middle Station: -3 Presentation: Vertex Exam by:: Dr Earlene Plater  Assessment and Plan: 22 y.o. G1P0 [redacted]w[redacted]d here for PROM.   Labor: PROM at 0200. Contracting too frequently for additional dose of cytotec.  -- FB placed 1900 - counseled on slightly increased risk of infection with both prolonged ROM and FB once ROM, agreeable to FB  -- cytotec x 2 (1430)  -- pain control: undecided -- PPH Risk: low   Fetal Well-Being: EFW 7lbs by Leopold's. Cephalic by sutures.  -- Category I - continuous fetal monitoring  -- GBS negative   Mackinley Kiehn S. Earlene Plater, DO OB/GYN Fellow, Faculty Practice  6:33 PM

## 2018-02-14 ENCOUNTER — Inpatient Hospital Stay (HOSPITAL_COMMUNITY): Payer: BLUE CROSS/BLUE SHIELD | Admitting: Anesthesiology

## 2018-02-14 ENCOUNTER — Encounter (HOSPITAL_COMMUNITY): Payer: Self-pay | Admitting: *Deleted

## 2018-02-14 ENCOUNTER — Encounter (HOSPITAL_COMMUNITY)
Admission: AD | Disposition: A | Discharge status: Home / Self Care | Payer: Self-pay | Source: Home / Self Care | Attending: Family Medicine

## 2018-02-14 DIAGNOSIS — O411230 Chorioamnionitis, third trimester, not applicable or unspecified: Secondary | ICD-10-CM

## 2018-02-14 DIAGNOSIS — Z3A39 39 weeks gestation of pregnancy: Secondary | ICD-10-CM

## 2018-02-14 DIAGNOSIS — O42113 Preterm premature rupture of membranes, onset of labor more than 24 hours following rupture, third trimester: Secondary | ICD-10-CM

## 2018-02-14 SURGERY — Surgical Case
Anesthesia: Epidural

## 2018-02-14 MED ORDER — COCONUT OIL OIL
1.0000 "application " | TOPICAL_OIL | Status: DC | PRN
  Administered 2018-02-15: 1 via TOPICAL
  Filled 2018-02-14: qty 120

## 2018-02-14 MED ORDER — OXYTOCIN 40 UNITS IN LACTATED RINGERS INFUSION - SIMPLE MED
1.0000 m[IU]/min | INTRAVENOUS | Status: DC
  Administered 2018-02-14: 2 m[IU]/min via INTRAVENOUS
  Filled 2018-02-14: qty 1000

## 2018-02-14 MED ORDER — ACETAMINOPHEN 325 MG PO TABS: 325.0000 mg | ORAL_TABLET | ORAL | Status: DC | PRN

## 2018-02-14 MED ORDER — ACETAMINOPHEN 500 MG PO TABS: 1000.0000 mg | ORAL_TABLET | Freq: Four times a day (QID) | ORAL | Status: DC | PRN

## 2018-02-14 MED ORDER — ONDANSETRON HCL 4 MG/2ML IJ SOLN: 4.0000 mg | Freq: Once | INTRAMUSCULAR | Status: DC | PRN

## 2018-02-14 MED ORDER — SODIUM CHLORIDE 0.9 % IV SOLN
2.0000 g | Freq: Four times a day (QID) | INTRAVENOUS | Status: DC
  Filled 2018-02-14 (×2): qty 2000

## 2018-02-14 MED ORDER — DEXAMETHASONE SODIUM PHOSPHATE 10 MG/ML IJ SOLN
INTRAMUSCULAR | Status: DC | PRN
  Administered 2018-02-14: 4 mg via INTRAVENOUS

## 2018-02-14 MED ORDER — FENTANYL CITRATE (PF) 100 MCG/2ML IJ SOLN: 25.0000 ug | INTRAMUSCULAR | Status: DC | PRN

## 2018-02-14 MED ORDER — ALBUMIN HUMAN 5 % IV SOLN
INTRAVENOUS | Status: DC | PRN
  Administered 2018-02-14: 09:00:00 via INTRAVENOUS

## 2018-02-14 MED ORDER — SENNOSIDES-DOCUSATE SODIUM 8.6-50 MG PO TABS
2.0000 | ORAL_TABLET | ORAL | Status: DC
  Administered 2018-02-14 – 2018-02-16 (×3): 2 via ORAL
  Filled 2018-02-14 (×3): qty 2

## 2018-02-14 MED ORDER — SCOPOLAMINE 1 MG/3DAYS TD PT72
MEDICATED_PATCH | TRANSDERMAL | Status: DC | PRN
  Administered 2018-02-14: 1 via TRANSDERMAL

## 2018-02-14 MED ORDER — OXYCODONE-ACETAMINOPHEN 5-325 MG PO TABS: 1.0000 | ORAL_TABLET | ORAL | Status: DC | PRN

## 2018-02-14 MED ORDER — PHENYLEPHRINE 40 MCG/ML (10ML) SYRINGE FOR IV PUSH (FOR BLOOD PRESSURE SUPPORT): 80.0000 ug | PREFILLED_SYRINGE | INTRAVENOUS | Status: DC | PRN

## 2018-02-14 MED ORDER — MEPERIDINE HCL 25 MG/ML IJ SOLN: 6.2500 mg | INTRAMUSCULAR | Status: DC | PRN

## 2018-02-14 MED ORDER — NALBUPHINE HCL 10 MG/ML IJ SOLN: 5.0000 mg | Freq: Once | INTRAMUSCULAR | Status: DC | PRN

## 2018-02-14 MED ORDER — MAGNESIUM HYDROXIDE 400 MG/5ML PO SUSP: 30.0000 mL | ORAL | Status: DC | PRN

## 2018-02-14 MED ORDER — EPHEDRINE 5 MG/ML INJ
10.0000 mg | INTRAVENOUS | Status: DC | PRN
  Administered 2018-02-14: 10 mg via INTRAVENOUS

## 2018-02-14 MED ORDER — PHENYLEPHRINE 40 MCG/ML (10ML) SYRINGE FOR IV PUSH (FOR BLOOD PRESSURE SUPPORT)
PREFILLED_SYRINGE | INTRAVENOUS | Status: DC | PRN
  Administered 2018-02-14 (×2): 80 ug via INTRAVENOUS
  Administered 2018-02-14: 40 ug via INTRAVENOUS

## 2018-02-14 MED ORDER — LACTATED RINGERS IV SOLN
INTRAVENOUS | Status: DC | PRN
  Administered 2018-02-14: 08:00:00 via INTRAVENOUS

## 2018-02-14 MED ORDER — IBUPROFEN 600 MG PO TABS
600.0000 mg | ORAL_TABLET | Freq: Four times a day (QID) | ORAL | Status: DC
  Administered 2018-02-14 – 2018-02-17 (×12): 600 mg via ORAL
  Filled 2018-02-14 (×11): qty 1

## 2018-02-14 MED ORDER — ACETAMINOPHEN 325 MG PO TABS: 650.0000 mg | ORAL_TABLET | ORAL | Status: DC | PRN

## 2018-02-14 MED ORDER — LACTATED RINGERS IV SOLN: 500.0000 mL | Freq: Once | INTRAVENOUS | Status: DC

## 2018-02-14 MED ORDER — METHYLERGONOVINE MALEATE 0.2 MG/ML IJ SOLN
INTRAMUSCULAR | Status: DC | PRN
  Administered 2018-02-14: 0.2 mg via INTRAMUSCULAR

## 2018-02-14 MED ORDER — ONDANSETRON HCL 4 MG/2ML IJ SOLN: 4.0000 mg | Freq: Three times a day (TID) | INTRAMUSCULAR | Status: DC | PRN

## 2018-02-14 MED ORDER — SIMETHICONE 80 MG PO CHEW: 80.0000 mg | CHEWABLE_TABLET | ORAL | Status: DC | PRN

## 2018-02-14 MED ORDER — MORPHINE SULFATE (PF) 0.5 MG/ML IJ SOLN
INTRAMUSCULAR | Status: AC
  Filled 2018-02-14: qty 10

## 2018-02-14 MED ORDER — FENTANYL 2.5 MCG/ML BUPIVACAINE 1/10 % EPIDURAL INFUSION (WH - ANES)
INTRAMUSCULAR | Status: AC
  Filled 2018-02-14: qty 100

## 2018-02-14 MED ORDER — ONDANSETRON HCL 4 MG/2ML IJ SOLN
INTRAMUSCULAR | Status: DC | PRN
  Administered 2018-02-14: 4 mg via INTRAVENOUS

## 2018-02-14 MED ORDER — PRENATAL MULTIVITAMIN CH
1.0000 | ORAL_TABLET | Freq: Every day | ORAL | Status: DC
  Administered 2018-02-15 – 2018-02-17 (×3): 1 via ORAL
  Filled 2018-02-14 (×3): qty 1

## 2018-02-14 MED ORDER — NALBUPHINE HCL 10 MG/ML IJ SOLN: 5.0000 mg | INTRAMUSCULAR | Status: DC | PRN

## 2018-02-14 MED ORDER — OXYCODONE HCL 5 MG PO TABS: 5.0000 mg | ORAL_TABLET | Freq: Once | ORAL | Status: DC | PRN

## 2018-02-14 MED ORDER — ALBUMIN HUMAN 5 % IV SOLN
INTRAVENOUS | Status: AC
  Filled 2018-02-14: qty 250

## 2018-02-14 MED ORDER — OXYTOCIN 10 UNIT/ML IJ SOLN
INTRAVENOUS | Status: DC | PRN
  Administered 2018-02-14: 40 [IU] via INTRAVENOUS

## 2018-02-14 MED ORDER — TERBUTALINE SULFATE 1 MG/ML IJ SOLN
0.2500 mg | Freq: Once | INTRAMUSCULAR | Status: AC
  Administered 2018-02-14: 0.25 mg via SUBCUTANEOUS

## 2018-02-14 MED ORDER — LIDOCAINE-EPINEPHRINE (PF) 2 %-1:200000 IJ SOLN
INTRAMUSCULAR | Status: AC
  Filled 2018-02-14: qty 20

## 2018-02-14 MED ORDER — OXYTOCIN 10 UNIT/ML IJ SOLN
INTRAMUSCULAR | Status: AC
  Filled 2018-02-14: qty 4

## 2018-02-14 MED ORDER — LIDOCAINE HCL (PF) 1 % IJ SOLN
INTRAMUSCULAR | Status: DC | PRN
  Administered 2018-02-14 (×2): 4 mL via EPIDURAL

## 2018-02-14 MED ORDER — ZOLPIDEM TARTRATE 5 MG PO TABS: 5.0000 mg | ORAL_TABLET | Freq: Every evening | ORAL | Status: DC | PRN

## 2018-02-14 MED ORDER — NALOXONE HCL 0.4 MG/ML IJ SOLN: 0.4000 mg | INTRAMUSCULAR | Status: DC | PRN

## 2018-02-14 MED ORDER — OXYCODONE-ACETAMINOPHEN 5-325 MG PO TABS: 2.0000 | ORAL_TABLET | ORAL | Status: DC | PRN

## 2018-02-14 MED ORDER — KETOROLAC TROMETHAMINE 30 MG/ML IJ SOLN
INTRAMUSCULAR | Status: AC
  Filled 2018-02-14: qty 1

## 2018-02-14 MED ORDER — LACTATED RINGERS IV SOLN
INTRAVENOUS | Status: DC | PRN
  Administered 2018-02-14 (×2): via INTRAVENOUS

## 2018-02-14 MED ORDER — GENTAMICIN SULFATE 40 MG/ML IJ SOLN
1.5000 mg/kg | Freq: Three times a day (TID) | INTRAVENOUS | Status: DC
  Filled 2018-02-14: qty 2.75

## 2018-02-14 MED ORDER — METHYLERGONOVINE MALEATE 0.2 MG/ML IJ SOLN
INTRAMUSCULAR | Status: AC
  Filled 2018-02-14: qty 1

## 2018-02-14 MED ORDER — PHENYLEPHRINE 40 MCG/ML (10ML) SYRINGE FOR IV PUSH (FOR BLOOD PRESSURE SUPPORT)
PREFILLED_SYRINGE | INTRAVENOUS | Status: AC
  Filled 2018-02-14: qty 20

## 2018-02-14 MED ORDER — FERROUS SULFATE 325 (65 FE) MG PO TABS
325.0000 mg | ORAL_TABLET | Freq: Two times a day (BID) | ORAL | Status: DC
  Administered 2018-02-14 – 2018-02-16 (×5): 325 mg via ORAL
  Filled 2018-02-14 (×5): qty 1

## 2018-02-14 MED ORDER — ONDANSETRON HCL 4 MG/2ML IJ SOLN
INTRAMUSCULAR | Status: AC
  Filled 2018-02-14: qty 4

## 2018-02-14 MED ORDER — DIBUCAINE 1 % RE OINT: 1.0000 "application " | TOPICAL_OINTMENT | RECTAL | Status: DC | PRN

## 2018-02-14 MED ORDER — WITCH HAZEL-GLYCERIN EX PADS: 1.0000 "application " | MEDICATED_PAD | CUTANEOUS | Status: DC | PRN

## 2018-02-14 MED ORDER — CEFAZOLIN SODIUM-DEXTROSE 2-4 GM/100ML-% IV SOLN
INTRAVENOUS | Status: AC
  Filled 2018-02-14: qty 100

## 2018-02-14 MED ORDER — DIPHENHYDRAMINE HCL 25 MG PO CAPS: 25.0000 mg | ORAL_CAPSULE | ORAL | Status: DC | PRN

## 2018-02-14 MED ORDER — NALOXONE HCL 4 MG/10ML IJ SOLN
1.0000 ug/kg/h | INTRAVENOUS | Status: DC | PRN
  Filled 2018-02-14: qty 5

## 2018-02-14 MED ORDER — KETOROLAC TROMETHAMINE 30 MG/ML IJ SOLN: 30.0000 mg | Freq: Four times a day (QID) | INTRAMUSCULAR | Status: AC | PRN

## 2018-02-14 MED ORDER — MEASLES, MUMPS & RUBELLA VAC ~~LOC~~ INJ
0.5000 mL | INJECTION | Freq: Once | SUBCUTANEOUS | Status: AC
  Administered 2018-02-17: 0.5 mL via SUBCUTANEOUS
  Filled 2018-02-14 (×2): qty 0.5

## 2018-02-14 MED ORDER — DIPHENHYDRAMINE HCL 50 MG/ML IJ SOLN: 12.5000 mg | INTRAMUSCULAR | Status: DC | PRN

## 2018-02-14 MED ORDER — ONDANSETRON HCL 4 MG/2ML IJ SOLN
INTRAMUSCULAR | Status: AC
  Filled 2018-02-14: qty 2

## 2018-02-14 MED ORDER — LIDOCAINE-EPINEPHRINE (PF) 2 %-1:200000 IJ SOLN
INTRAMUSCULAR | Status: DC | PRN
  Administered 2018-02-14: 3 mL via EPIDURAL
  Administered 2018-02-14: 7 mL via EPIDURAL

## 2018-02-14 MED ORDER — SODIUM BICARBONATE 8.4 % IV SOLN
INTRAVENOUS | Status: AC
  Filled 2018-02-14: qty 50

## 2018-02-14 MED ORDER — OXYTOCIN 40 UNITS IN LACTATED RINGERS INFUSION - SIMPLE MED
INTRAVENOUS | Status: AC
  Filled 2018-02-14: qty 1000

## 2018-02-14 MED ORDER — OXYCODONE HCL 5 MG/5ML PO SOLN: 5.0000 mg | Freq: Once | ORAL | Status: DC | PRN

## 2018-02-14 MED ORDER — OXYTOCIN 40 UNITS IN LACTATED RINGERS INFUSION - SIMPLE MED: 2.5000 [IU]/h | INTRAVENOUS | Status: AC

## 2018-02-14 MED ORDER — DIPHENHYDRAMINE HCL 25 MG PO CAPS: 25.0000 mg | ORAL_CAPSULE | Freq: Four times a day (QID) | ORAL | Status: DC | PRN

## 2018-02-14 MED ORDER — CEFAZOLIN SODIUM-DEXTROSE 2-3 GM-%(50ML) IV SOLR
INTRAVENOUS | Status: DC | PRN
  Administered 2018-02-14: 2 g via INTRAVENOUS

## 2018-02-14 MED ORDER — SCOPOLAMINE 1 MG/3DAYS TD PT72
MEDICATED_PATCH | TRANSDERMAL | Status: AC
  Filled 2018-02-14: qty 1

## 2018-02-14 MED ORDER — ACETAMINOPHEN 160 MG/5ML PO SOLN: 325.0000 mg | ORAL | Status: DC | PRN

## 2018-02-14 MED ORDER — KETOROLAC TROMETHAMINE 30 MG/ML IJ SOLN
30.0000 mg | Freq: Four times a day (QID) | INTRAMUSCULAR | Status: AC | PRN
  Administered 2018-02-14: 30 mg via INTRAMUSCULAR

## 2018-02-14 MED ORDER — LACTATED RINGERS IV SOLN
INTRAVENOUS | Status: DC
  Administered 2018-02-15: 03:00:00 via INTRAVENOUS

## 2018-02-14 MED ORDER — SIMETHICONE 80 MG PO CHEW
80.0000 mg | CHEWABLE_TABLET | ORAL | Status: DC
  Administered 2018-02-14 – 2018-02-16 (×3): 80 mg via ORAL
  Filled 2018-02-14 (×3): qty 1

## 2018-02-14 MED ORDER — PHENYLEPHRINE 40 MCG/ML (10ML) SYRINGE FOR IV PUSH (FOR BLOOD PRESSURE SUPPORT)
80.0000 ug | PREFILLED_SYRINGE | INTRAVENOUS | Status: AC | PRN
  Administered 2018-02-14 (×3): 80 ug via INTRAVENOUS

## 2018-02-14 MED ORDER — TETANUS-DIPHTH-ACELL PERTUSSIS 5-2.5-18.5 LF-MCG/0.5 IM SUSP: 0.5000 mL | Freq: Once | INTRAMUSCULAR | Status: DC

## 2018-02-14 MED ORDER — ENOXAPARIN SODIUM 40 MG/0.4ML ~~LOC~~ SOLN
40.0000 mg | SUBCUTANEOUS | Status: DC
  Administered 2018-02-15 – 2018-02-17 (×3): 40 mg via SUBCUTANEOUS
  Filled 2018-02-14 (×3): qty 0.4

## 2018-02-14 MED ORDER — FENTANYL 2.5 MCG/ML BUPIVACAINE 1/10 % EPIDURAL INFUSION (WH - ANES)
14.0000 mL/h | INTRAMUSCULAR | Status: DC | PRN
  Administered 2018-02-14: 12 mL/h via EPIDURAL

## 2018-02-14 MED ORDER — DEXAMETHASONE SODIUM PHOSPHATE 4 MG/ML IJ SOLN
INTRAMUSCULAR | Status: AC
  Filled 2018-02-14: qty 1

## 2018-02-14 MED ORDER — SCOPOLAMINE 1 MG/3DAYS TD PT72: 1.0000 | MEDICATED_PATCH | Freq: Once | TRANSDERMAL | Status: DC

## 2018-02-14 MED ORDER — SODIUM CHLORIDE 0.9% FLUSH: 3.0000 mL | INTRAVENOUS | Status: DC | PRN

## 2018-02-14 MED ORDER — MENTHOL 3 MG MT LOZG: 1.0000 | LOZENGE | OROMUCOSAL | Status: DC | PRN

## 2018-02-14 MED ORDER — MORPHINE SULFATE (PF) 0.5 MG/ML IJ SOLN
INTRAMUSCULAR | Status: DC | PRN
  Administered 2018-02-14: 3 mg via EPIDURAL

## 2018-02-14 SURGICAL SUPPLY — 32 items
CHLORAPREP W/TINT 26ML (MISCELLANEOUS) ×3 IMPLANT
CLAMP CORD UMBIL (MISCELLANEOUS) IMPLANT
CLOTH BEACON ORANGE TIMEOUT ST (SAFETY) ×3 IMPLANT
DRSG OPSITE POSTOP 4X10 (GAUZE/BANDAGES/DRESSINGS) ×3 IMPLANT
ELECT REM PT RETURN 9FT ADLT (ELECTROSURGICAL) ×3
ELECTRODE REM PT RTRN 9FT ADLT (ELECTROSURGICAL) ×1 IMPLANT
EXTRACTOR VACUUM M CUP 4 TUBE (SUCTIONS) IMPLANT
EXTRACTOR VACUUM M CUP 4' TUBE (SUCTIONS)
GLOVE BIOGEL PI IND STRL 7.0 (GLOVE) ×3 IMPLANT
GLOVE BIOGEL PI INDICATOR 7.0 (GLOVE) ×6
GLOVE ECLIPSE 7.0 STRL STRAW (GLOVE) ×3 IMPLANT
GOWN STRL REUS W/TWL LRG LVL3 (GOWN DISPOSABLE) ×6 IMPLANT
KIT ABG SYR 3ML LUER SLIP (SYRINGE) ×3 IMPLANT
NEEDLE HYPO 22GX1.5 SAFETY (NEEDLE) ×3 IMPLANT
NEEDLE HYPO 25X5/8 SAFETYGLIDE (NEEDLE) ×6 IMPLANT
NS IRRIG 1000ML POUR BTL (IV SOLUTION) ×3 IMPLANT
PACK C SECTION WH (CUSTOM PROCEDURE TRAY) ×3 IMPLANT
PAD ABD 7.5X8 STRL (GAUZE/BANDAGES/DRESSINGS) ×3 IMPLANT
PAD ABD 8X7 1/2 STERILE (GAUZE/BANDAGES/DRESSINGS) ×6 IMPLANT
PAD OB MATERNITY 4.3X12.25 (PERSONAL CARE ITEMS) ×3 IMPLANT
PENCIL SMOKE EVAC W/HOLSTER (ELECTROSURGICAL) ×3 IMPLANT
RTRCTR C-SECT PINK 25CM LRG (MISCELLANEOUS) IMPLANT
SPONGE LAP 18X18 RF (DISPOSABLE) ×12 IMPLANT
SUT PDS AB 0 CTX 36 PDP370T (SUTURE) IMPLANT
SUT PLAIN 2 0 XLH (SUTURE) IMPLANT
SUT VIC AB 0 CTX 36 (SUTURE) ×4
SUT VIC AB 0 CTX36XBRD ANBCTRL (SUTURE) ×2 IMPLANT
SUT VIC AB 4-0 KS 27 (SUTURE) ×3 IMPLANT
SYR CONTROL 10ML LL (SYRINGE) ×3 IMPLANT
TAPE CLOTH SURG 4X10 WHT LF (GAUZE/BANDAGES/DRESSINGS) ×3 IMPLANT
TOWEL OR 17X24 6PK STRL BLUE (TOWEL DISPOSABLE) ×3 IMPLANT
TRAY FOLEY W/BAG SLVR 14FR LF (SET/KITS/TRAYS/PACK) ×3 IMPLANT

## 2018-02-14 NOTE — Anesthesia Procedure Notes (Signed)
Epidural Patient location during procedure: OB Start time: 02/14/2018 12:47 AM End time: 02/14/2018 12:55 AM  Staffing Anesthesiologist: Kaylyn Layer, MD Performed: anesthesiologist   Preanesthetic Checklist Completed: patient identified, pre-op evaluation, timeout performed, IV checked, risks and benefits discussed and monitors and equipment checked  Epidural Patient position: sitting Prep: site prepped and draped and DuraPrep Patient monitoring: continuous pulse ox, blood pressure, heart rate and cardiac monitor Approach: midline Location: L2-L3 Injection technique: LOR air  Needle:  Needle type: Tuohy  Needle gauge: 17 G Needle length: 9 cm Needle insertion depth: 6 cm Catheter type: closed end flexible Catheter size: 19 Gauge Catheter at skin depth: 11 cm Test dose: negative and Other (1% lidocaine)  Assessment Events: blood aspirated (Positive on first attempt at L3-4. Negative aspiration on second placement at L2-3.), injection not painful, no injection resistance, negative IV test and no paresthesia  Additional Notes Patient identified. Risks, benefits, and alternatives discussed with patient including but not limited to bleeding, infection, nerve damage, paralysis, failed block, incomplete pain control, headache, blood pressure changes, nausea, vomiting, reactions to medication, itching, and postpartum back pain. Confirmed with bedside nurse the patient's most recent platelet count. Confirmed with patient that they are not currently taking any anticoagulation, have any bleeding history, or any family history of bleeding disorders. Patient expressed understanding and wished to proceed. All questions were answered. Sterile technique was used throughout the entire procedure. Please see nursing notes for vital signs. First placement of epidural catheter at L3-4 level with positive aspiration of heme. Catheter removed and second needle pass made at L2-3 level with crisp LOR  on first attempt with negative aspiration. Test dose was given through epidural catheter and negative prior to continuing to dose epidural or start infusion. Warning signs of high block given to the patient including shortness of breath, tingling/numbness in hands, complete motor block, or any concerning symptoms with instructions to call for help. Patient was given instructions on fall risk and not to get out of bed. All questions and concerns addressed with instructions to call with any issues or inadequate analgesia.  Reason for block:procedure for pain

## 2018-02-14 NOTE — Anesthesia Preprocedure Evaluation (Signed)
Anesthesia Evaluation  Patient identified by MRN, date of birth, ID band Patient awake    Reviewed: Allergy & Precautions, Patient's Chart, lab work & pertinent test results  History of Anesthesia Complications Negative for: history of anesthetic complications  Airway Mallampati: II  TM Distance: >3 FB Neck ROM: Full    Dental no notable dental hx. (+) Teeth Intact   Pulmonary neg pulmonary ROS,    Pulmonary exam normal breath sounds clear to auscultation       Cardiovascular negative cardio ROS Normal cardiovascular exam Rhythm:Regular Rate:Normal     Neuro/Psych negative neurological ROS  negative psych ROS   GI/Hepatic negative GI ROS, Neg liver ROS,   Endo/Other  negative endocrine ROS  Renal/GU negative Renal ROS  negative genitourinary   Musculoskeletal negative musculoskeletal ROS (+)   Abdominal   Peds negative pediatric ROS (+)  Hematology negative hematology ROS (+)   Anesthesia Other Findings   Reproductive/Obstetrics (+) Pregnancy                            Anesthesia Physical Anesthesia Plan  ASA: II  Anesthesia Plan: Epidural   Post-op Pain Management:    Induction: Intravenous  PONV Risk Score and Plan: 2 and Treatment may vary due to age or medical condition  Airway Management Planned: Natural Airway  Additional Equipment:   Intra-op Plan:   Post-operative Plan:   Informed Consent: I have reviewed the patients History and Physical, chart, labs and discussed the procedure including the risks, benefits and alternatives for the proposed anesthesia with the patient or authorized representative who has indicated his/her understanding and acceptance.     Plan Discussed with: CRNA  Anesthesia Plan Comments:         Anesthesia Quick Evaluation

## 2018-02-14 NOTE — Anesthesia Postprocedure Evaluation (Signed)
Anesthesia Post Note  Patient: Bridget Bray  Procedure(s) Performed: CESAREAN SECTION (N/A )     Patient location during evaluation: [facility] Unit Anesthesia Type: Epidural Level of consciousness: awake and alert, oriented and awake Pain management: pain level controlled Vital Signs Assessment: post-procedure vital signs reviewed and stable Respiratory status: spontaneous breathing and respiratory function stable Cardiovascular status: blood pressure returned to baseline Postop Assessment: no headache, no backache, epidural receding, patient able to bend at knees, no apparent nausea or vomiting, adequate PO intake and able to ambulate Anesthetic complications: no    Last Vitals:  Vitals:   02/14/18 1039 02/14/18 1125  BP:  90/62  Pulse: 91 78  Resp:  16  Temp:  37.5 C  SpO2:      Last Pain:  Vitals:   02/14/18 1134  TempSrc:   PainSc: 0-No pain   Pain Goal:                 Cleda Clarks

## 2018-02-14 NOTE — Progress Notes (Signed)
Called to evaluate patient for recurrent prolonged decelerations. Had received terbutaline, other intrauterine neonatal resuscitation efforts were done.  FHR deceleration down to 70-80s for seven minutes, returned to baseline of 160-170s with moderate variability, then had another deceleration lasting about 3-4 minutes. At this point, was discussing cesarean delivery as cervical exam showed patient to be 5-6 cm dilated and remote from vaginal delivery. While discussing this, she had yet another prolonged deceleration.  Risks of cesarean section discussed and Code Cesarean was activated. To OR for recurrent prolonged FHR decelerations.   Jaynie Collins, MD, FACOG Obstetrician & Gynecologist, [facility] for [employer], [facility]

## 2018-02-14 NOTE — Transfer of Care (Signed)
Immediate Anesthesia Transfer of Care Note  Patient: Bridget Bray  Procedure(s) Performed: CESAREAN SECTION (N/A )  Patient Location: PACU  Anesthesia Type:Epidural  Level of Consciousness: awake, alert  and oriented  Airway & Oxygen Therapy: Patient Spontanous Breathing  Post-op Assessment: Report given to RN and Post -op Vital signs reviewed and stable  Post vital signs: Reviewed and stable  Last Vitals:  Vitals Value Taken Time  BP 101/51 02/14/2018  9:04 AM  Temp    Pulse 116 02/14/2018  9:08 AM  Resp 22 02/14/2018  9:08 AM  SpO2 100 % 02/14/2018  9:08 AM  Vitals shown include unvalidated device data.  Last Pain:  Vitals:   02/14/18 0721  TempSrc: Axillary  PainSc: 7          Complications: No apparent anesthesia complications

## 2018-02-14 NOTE — Progress Notes (Signed)
OB/GYN Faculty Practice: Labor Progress Note  Subjective: Feeling more comfortable since epidural, neck hurts. Family in room.   Objective: BP 103/70   Pulse (!) 105   Temp 98.2 F (36.8 C) (Oral)   Resp 16   Ht 4\' 11"  (1.499 m)   Wt 75.9 kg   LMP 05/13/2017   SpO2 99%   BMI 33.81 kg/m  Gen: tired appearing  Dilation: 5.5 Effacement (%): 70 Cervical Position: Middle Station: -1 Presentation: Vertex Exam by:: S Brendle RN  Assessment and Plan: 22 y.o. G1P0 [redacted]w[redacted]d here for PROM.   Labor: PROM at 0200 02/14/18. SVE unchanged from prior check, have not been able to increase pitocin because of contraction frequency so IUPC placed.  -- FB placed 1900  -- cytotec x 2 (1430)  -- pain control: planning for epidural  fentanyl IV, morphine 6mg  IV, morphine 10mg  IM > will trial additional dose of IV morphien  -- PPH Risk: low   Fetal Well-Being: EFW 7lbs by Leopold's. Cephalic by sutures.  -- Category I - continuous fetal monitoring  -- GBS negative   Keatyn Jawad S. Earlene Plater, DO OB/GYN Fellow, Faculty Practice  5:21 AM

## 2018-02-14 NOTE — Lactation Note (Signed)
This note was copied from a baby's chart. Lactation Consultation Note  Patient Name: Bridget Bray ZOXWR'U Date: 02/14/2018 Reason for consult: Initial assessment;NICU baby Baby is 7 hours old in the NICU.  Mom would like to provide breastmilk for her baby.  Symphony pump set up and initiated.  Instructed to pump and hand express every 3 hours x 15 minutes.  Mom does not have a pump at home.  Instructed to call insurance company regarding a pump.  Mom does not have WIC.  Encouraged to call for assist/concerns prn.  Maternal Data    Feeding    LATCH Score                   Interventions    Lactation Tools Discussed/Used WIC Program: No Pump Review: Setup, frequency, and cleaning;Milk Storage Initiated by:: LM Date initiated:: 02/14/18   Consult Status Consult Status: Follow-up Date: 02/15/18 Follow-up type: In-patient    Huston Foley 02/14/2018, 3:12 PM

## 2018-02-14 NOTE — Progress Notes (Signed)
Patient's recovery in PACU complete. Bleeding WNL and VSS. NICU states that they are not ready for the pt or FOB to come see the baby. 3rd floor called and ready to receive patient.

## 2018-02-14 NOTE — Op Note (Addendum)
Bridget Bray PROCEDURE DATE: 02/14/2018  PREOPERATIVE DIAGNOSES: Intrauterine pregnancy at [redacted]w[redacted]d weeks gestation; non-reassuring fetal status; prolonged PROM; high risk of chorioamnionitis given maternal high temperature  POSTOPERATIVE DIAGNOSES: The same  PROCEDURE: Urgent Primary Low Transverse Cesarean Section  SURGEON:  Dr. Jaynie Collins  ASSISTANT:  Dr. Rhett Bannister  ANESTHESIOLOGY TEAM: Anesthesiologist: Kaylyn Layer, MD; Bethena Midget, MD CRNA: Elgie Congo, CRNA  INDICATIONS: Bridget Bray is a 22 y.o. G1P0 at [redacted]w[redacted]d here for urgent cesarean section secondary to the indications listed under preoperative diagnoses; please see preoperative note for further details.  The risks of cesarean section were discussed with the patient including but were not limited to: bleeding; infection; injury to surrounding organs; injury to the fetus; need for additional procedures, thromboembolic phenomenon and other postoperative/anesthesia complications.   The patient concurred with the proposed plan, giving informed written consent for the procedure.    FINDINGS:  Viable female infant in cephalic presentation.  Apgars 4/6/7.  Arterial cord pH 7.02.  Clear amniotic fluid.  Intact placenta, three vessel cord.  Normal uterus, fallopian tubes and ovaries bilaterally. There was concern about chorioamnionitis given maternal elevated temperature, neonatology team was informed.  ANESTHESIA: Epidural  INTRAVENOUS FLUIDS: 2850 ml   ESTIMATED BLOOD LOSS: 960 ml URINE OUTPUT:  1175 ml SPECIMENS: Placenta sent to pathology COMPLICATIONS: None immediate  PROCEDURE IN DETAIL:  The patient was urgently taken to the the operating room her epidural anesthesia was dosed up to surgical level and was found to be adequate. She was then placed in a dorsal supine position with a leftward tilt, prepped quickly with betadine and draped in a sterile manner.  She already had a foley catheter in  her bladder from L&D.  After a timeout was performed, a Pfannenstiel skin incision was made with scalpel and carried through to the underlying layer of fascia. The fascial incision was extended bilaterally in a blunt fashion.  The fascia was separated from underlying rectus muscles bluntly.  The rectus muscles were separated in the midline bluntly and the peritoneum was entered bluntly. Attention was turned to the lower uterine segment where a low transverse hysterotomy was made with a scalpel and extended bilaterally bluntly.  The infant was successfully delivered, the cord was clamped and cut and the infant was handed over to awaiting neonatology team. Incision to delivery time was less than two minutes.  The placenta was delivered intact and had a three-vessel cord. The uterus was then cleared of clot and debris.  The hysterotomy was closed with 0 Vicryl in a running locked fashion, and an imbricating layer was also placed with 0 Vicryl. The pelvis was cleared of all clot and debris. Hemostasis was confirmed on all surfaces. The peritoneal layer was closed with a 0 Vicryl running stitch.  The fascia was then closed using 0 Vicryl in a running fashion.  The subcutaneous layer was irrigated and the skin was closed with a 4-0 Vicryl subcuticular stitch. The patient tolerated the procedure well. Sponge, instrument and needle counts were correct x 3.  She was taken to the recovery room in stable condition.     Jaynie Collins, MD, FACOG Obstetrician & Gynecologist, [facility] for [employer], [facility]

## 2018-02-15 ENCOUNTER — Encounter (HOSPITAL_COMMUNITY): Payer: Self-pay | Admitting: Obstetrics & Gynecology

## 2018-02-15 LAB — CBC
HEMATOCRIT: 24.6 % — AB (ref 36.0–46.0)
Hemoglobin: 8.6 g/dL — ABNORMAL LOW (ref 12.0–15.0)
MCH: 32.3 pg (ref 26.0–34.0)
MCHC: 35 g/dL (ref 30.0–36.0)
MCV: 92.5 fL (ref 80.0–100.0)
NRBC: 0 % (ref 0.0–0.2)
Platelets: 138 10*3/uL — ABNORMAL LOW (ref 150–400)
RBC: 2.66 MIL/uL — ABNORMAL LOW (ref 3.87–5.11)
RDW: 13.9 % (ref 11.5–15.5)
WBC: 12.9 10*3/uL — ABNORMAL HIGH (ref 4.0–10.5)

## 2018-02-15 LAB — CREATININE, SERUM
CREATININE: 0.45 mg/dL (ref 0.44–1.00)
GFR calc Af Amer: >60 mL/min (ref >60)
GFR calc non Af Amer: >60 mL/min (ref >60)

## 2018-02-15 NOTE — Lactation Note (Signed)
This note was copied from a baby's chart. Lactation Consultation Note: Follow up visit with this mom of NICU baby. Reports she pumped 3 times yesterday and obtained small amounts each time. Has not yet pumped this morning. Reports pain with pumping. Encouraged to try #27 flanges- that looks like better fit. To call if that does not help. Has [employer]. Encouraged to call them about a pump for home. No questions at present. To call prn  Patient Name: Bridget Bray ZOXWR'U Date: 02/15/2018 Reason for consult: Follow-up assessment;NICU baby;1st time breastfeeding   Maternal Data Has patient been taught Hand Expression?: Yes Does the patient have breastfeeding experience prior to this delivery?: No  Feeding    LATCH Score                   Interventions    Lactation Tools Discussed/Used WIC Program: No   Consult Status Consult Status: Follow-up Date: 02/16/18 Follow-up type: In-patient    Bridget Bray 02/15/2018, 9:16 AM

## 2018-02-16 ENCOUNTER — Encounter: Payer: BLUE CROSS/BLUE SHIELD | Admitting: Obstetrics

## 2018-02-16 NOTE — Lactation Note (Signed)
This note was copied from a baby's chart. Lactation Consultation Note  Patient Name: Bridget Bray ZOXWR'U Date: 02/16/2018 Reason for consult: Mother's request;NICU baby  Mom says she has been pumping about 4 times/day, but also went to NICU to breastfeed twice yesterday. The last time Mom pumped today, she was able to fill a colostrum vial.   Mom had been using size 24 flanges & was then instructed yesterday to use size 27 flanges b/c of some mild cracking on her areola (on her L side). However, now on her R areola it is apparent that some skin on the areola has been sloughed off in a pattern that may mimic too much areola being pulled in with pumping. I will observe Mom pumping.   As the pump parts had not been washed since the last pumping, I showed Dad how to separate and wash pump parts. Mom was provided the CDC hand-out on washing breast pump kits.   Mom was viewed pumping with size 24 flanges (lubricated with a small amount of coconut oil). Mom was comfortable & able to pump a total of 15 mL. Mom was placed in shells so that the coconut oil she applied after pumping wouldn't rub off.   Mom only expressed her milk/stimulated her breasts 6 times yesterday. Mom was encouraged to express/stimulate 8-12 times/day for the 1st 2 weeks to achieve augment her potential supply.   Bridget Bray [facility] 02/16/2018, 11:57 AM

## 2018-02-16 NOTE — Progress Notes (Signed)
Subjective: Postpartum Day 2: Cesarean Delivery Patient reports feeling well. She is ambulating, voiding and tolerating po without complaints.    Objective: Vital signs in last 24 hours: Temp:  [98 F (36.7 C)-99.1 F (37.3 C)] 98 F (36.7 C) (10/18 0545) Pulse Rate:  [75-108] 75 (10/18 0545) Resp:  [17-18] 17 (10/18 0545) BP: (89-99)/(61-70) 97/68 (10/18 0545) SpO2:  [98 %-100 %] 98 % (10/18 0545)  Physical Exam:  General: alert, cooperative and no distress Lochia: appropriate Uterine Fundus: firm Incision: honey comb dressing intact DVT Evaluation: No evidence of DVT seen on physical exam. No significant calf/ankle edema.  Recent Labs    02/15/18 0534  HGB 8.6*  HCT 24.6*    Assessment/Plan: Status post Cesarean section. Doing well postoperatively.  Continue current care Discharge planning for tomorrow.  Zharia Conrow 02/16/2018, 9:49 AM

## 2018-02-17 MED ORDER — IBUPROFEN 600 MG PO TABS: 600.0000 mg | ORAL_TABLET | Freq: Four times a day (QID) | ORAL | 0 refills | Status: DC

## 2018-02-17 MED ORDER — OXYCODONE-ACETAMINOPHEN 5-325 MG PO TABS: 1.0000 | ORAL_TABLET | Freq: Four times a day (QID) | ORAL | 0 refills | Status: DC | PRN

## 2018-02-17 NOTE — Lactation Note (Signed)
This note was copied from a baby's chart. Lactation Consultation Note  Patient Name: Girl Kynzie Johnathan Hausen ZOXWR'U Date: 02/17/2018   Visited with P1 Mom of term baby in the NICU.  Baby 74 hrs old.  Mom has been diligent with double pumping.  Currently pumping, and milk volume coming in.  Has 60 ml already in bottles.  Breasts soft, not engorged.   Mom is expecting a DEBP from insurance as she called them yesterday.  Mom told of pump rentals available in hospital gift shop.  Recommended she rent a Symphony for a week until her insurance pump comes in mail. Reviewed importance of regular double pumping and breast massage and hand expression as well.  Mom knows to take her pump parts home to use with her rental, and also bring with her to the NICU (pump rooms). Mom aware of OP lactation support available to her.  Encouraged her to call prn.   Judee Clara 02/17/2018, 10:28 AM

## 2018-02-17 NOTE — Discharge Summary (Signed)
OB Discharge Summary     Patient Name: Bridget Bray DOB: 09-23-1995 MRN: [id number]  Date of admission: 02/13/2018 Delivering MD: Jaynie Collins A   Date of discharge: 02/17/2018  Admitting diagnosis: 39WKS WATER BROKE Intrauterine pregnancy: [redacted]w[redacted]d     Secondary diagnosis:  Active Problems:   PROM (premature rupture of membranes)     Discharge diagnosis: Term Pregnancy Delivered                                                                                                 Hospital course:  Induction of Labor With Cesarean Section  22 y.o. yo G1P1001 at [redacted]w[redacted]d was admitted to the hospital 02/13/2018 for induction of labor. Patient had a labor course significant for prolonged labor with development of chorio. The patient went for cesarean section due to Non-Reassuring FHR, and delivered a Viable infant,02/14/2018  Membrane Rupture Time/Date: 3:00 AM ,02/13/2018   Details of operation can be found in separate operative Note.  Patient had an uncomplicated postpartum course. She is ambulating, tolerating a regular diet, passing flatus, and urinating well.  Patient is discharged home in stable condition on 02/17/18.                                    Physical exam  Vitals:   02/16/18 1651 02/16/18 2145 02/16/18 2305 02/17/18 0555  BP: 92/69 104/64 99/63 108/65  Pulse: 83 86 77 67  Resp: 12 18 18 16   Temp: 98.3 F (36.8 C) 98.1 F (36.7 C) 97.7 F (36.5 C) 98.8 F (37.1 C)  TempSrc: Oral Oral Oral Oral  SpO2: 100% 100%    Weight:      Height:       General: alert, cooperative and no distress Lochia: appropriate Uterine Fundus: firm Incision: Dressing is clean, dry, and intact DVT Evaluation: No evidence of DVT seen on physical exam. No cords or calf tenderness. Labs: Lab Results  Component Value Date   WBC 12.9 (H) 02/15/2018   HGB 8.6 (L) 02/15/2018   HCT 24.6 (L) 02/15/2018   MCV 92.5 02/15/2018   PLT 138 (L) 02/15/2018   CMP Latest Ref Rng & Units  02/15/2018  Glucose 65 - 99 mg/dL -  BUN 7 - 18 mg/dL -  Creatinine 4.09 - 8.11 mg/dL 9.14  Sodium 782 - 956 mmol/L -  Potassium 3.5 - 5.1 mmol/L -  Chloride 98 - 107 mmol/L -  CO2 21 - 32 mmol/L -  Calcium 9.0 - 10.7 mg/dL -  Total Protein 6.4 - 8.6 g/dL -  Total Bilirubin 0.2 - 1.0 mg/dL -  Alkaline Phos Unit/L -  AST 0 - 26 Unit/L -  ALT 12 - 78 U/L -    Discharge instruction: per After Visit Summary and "Baby and Me Booklet".  After visit meds:  Allergies as of 02/17/2018   No Known Allergies     Medication List    TAKE these medications   ibuprofen 600 MG tablet Commonly known as:  ADVIL,MOTRIN Take 1  tablet (600 mg total) by mouth every 6 (six) hours.   oxyCODONE-acetaminophen 5-325 MG tablet Commonly known as:  PERCOCET/ROXICET Take 1-2 tablets by mouth every 6 (six) hours as needed.   prenatal multivitamin Tabs tablet Take 1 tablet by mouth daily at 12 noon.   Vitamin D (Ergocalciferol) 50000 units Caps capsule Commonly known as:  DRISDOL Take 1 capsule (50,000 Units total) by mouth every 7 (seven) days. What changed:  additional instructions       Diet: routine diet  Activity: Advance as tolerated. Pelvic rest for 6 weeks.   Outpatient follow up:2 weeks Follow up Appt: Future Appointments  Date Time Provider Department Center  02/28/2018  8:15 AM Reva Bores, MD [facility] None  03/14/2018  9:00 AM Ottavio Norem, Gigi Gin, MD [facility] None   Follow up Visit:No follow-ups on file.  Postpartum contraception: Progesterone only pills planned  Newborn Data: Live born female  Birth Weight: 7 lb 9.7 oz (3450 g) APGAR: 4, 6  Newborn Delivery   Birth date/time:  02/14/2018 08:02:00 Delivery type:  C-Section, Low Transverse Trial of labor:  Yes C-section categorization:  Primary     Baby Feeding: Breast Disposition:NICU   02/17/2018 Catalina Antigua, MD

## 2018-02-17 NOTE — Discharge Instructions (Signed)

## 2018-02-19 ENCOUNTER — Ambulatory Visit: Payer: Self-pay

## 2018-02-19 NOTE — Lactation Note (Signed)
This note was copied from a baby's chart. Lactation Consultation Note  Patient Name: Bridget Bray ZOXWR'U Date: 02/19/2018 Reason for consult: Follow-up assessment   NICU RN called LC upon DC of baby; concerns about latch and tongue resting at roof of mouth.    LC in NICU offered to assist mom with latching infant.  Infant last took 4 oz.  A couple of hours ago but infant cueing.  LC had dad assist with pillow positioning.  LC observed cupping of tongue and resting to roof of mouth, finger was pulled out easily when sucking on LC finger.    Infant placed in football hold and mom's breasts felt full.  Infant latched easily and LC taught dad how to assist with pulling at chin to encourage flanging of bottom lip.  Mom used massage and compression and multiple swallows heard. Infant sustained latch well and good rhythmic jaw movement heard. Mom is comfortable during feed.  Infant fed 10 minutes then fell asleep.  Mom was taught how to break latch. Nipple slightly pinched after feed. Infant was burped then cueing so placed at other breast to feed.   Infant latched and fed with spontaneous swallows for 8 minutes.  Then fell asleep.    Mom and dad were taught signs that baby is getting enough. They told LC this was the best feed she has had at the breast.    LC suggested out patient appointment; family will check with insurance to find out coverage prior to calling for appt.  Support group info given and number given to call in order to leave a voicemail or make OP appt.      Maternal Data    Feeding Feeding Type: Breast Fed Nipple Type: Slow - flow  LATCH Score                   Interventions Interventions: Breast feeding basics reviewed;Skin to skin;Assisted with latch;Breast massage;Hand express;Adjust position;Position options;Support pillows  Lactation Tools Discussed/Used     Consult Status Consult Status: Complete Date: 02/19/18 Follow-up type:  In-patient    Bridget Bray [facility] 02/19/2018, 12:46 PM

## 2018-02-23 ENCOUNTER — Encounter: Payer: BLUE CROSS/BLUE SHIELD | Admitting: Obstetrics

## 2018-02-24 ENCOUNTER — Inpatient Hospital Stay (HOSPITAL_COMMUNITY)
Admission: RE | Admit: 2018-02-24 | Discharge: 2018-02-24 | Disposition: A | Discharge status: Home / Self Care | Payer: BLUE CROSS/BLUE SHIELD | Source: Ambulatory Visit | Attending: Obstetrics and Gynecology | Admitting: Obstetrics and Gynecology

## 2018-02-28 ENCOUNTER — Other Ambulatory Visit: Payer: Self-pay

## 2018-02-28 ENCOUNTER — Ambulatory Visit (INDEPENDENT_AMBULATORY_CARE_PROVIDER_SITE_OTHER): Payer: BLUE CROSS/BLUE SHIELD | Admitting: Family Medicine

## 2018-02-28 ENCOUNTER — Encounter: Payer: Self-pay | Admitting: Family Medicine

## 2018-02-28 VITALS — BP 103/73 | HR 72 | Wt 142.9 lb

## 2018-02-28 DIAGNOSIS — Z09 Encounter for follow-up examination after completed treatment for conditions other than malignant neoplasm: Secondary | ICD-10-CM

## 2018-02-28 NOTE — Progress Notes (Signed)
Presents for 2 weeks PP Incision Check.  Reports no problems today.

## 2018-02-28 NOTE — Progress Notes (Signed)
   Subjective:    Patient ID: Bridget Bray is a 22 y.o. female presenting with Wound Check  on 02/28/2018  HPI: Here for incision check. C-section for Non-reassuring FHR  On 02/14/18. Feels well. No mood abnormality. Not needing any more pain meds.  Review of Systems  Constitutional: Negative for chills and fever.  Respiratory: Negative for shortness of breath.   Cardiovascular: Negative for chest pain.  Gastrointestinal: Negative for abdominal pain, nausea and vomiting.  Genitourinary: Negative for dysuria.  Skin: Negative for rash.      Objective:    BP 103/73   Pulse 72   Wt 142 lb 14.4 oz (64.8 kg)   LMP 05/13/2017   Breastfeeding? Yes   BMI 28.86 kg/m  Physical Exam  Constitutional: She is oriented to person, place, and time. She appears well-developed and well-nourished. No distress.  HENT:  Head: Normocephalic and atraumatic.  Eyes: No scleral icterus.  Neck: Neck supple.  Cardiovascular: Normal rate.  Pulmonary/Chest: Effort normal.  Abdominal: Soft.  Neurological: She is alert and oriented to person, place, and time.  Skin: Skin is warm and dry.  Incision is healing well. No erythema. Small area that is still open treated with AgNO3.  Psychiatric: She has a normal mood and affect.        Assessment & Plan:  Postop check - healing well. f/u at pp visit   Total face-to-face time with patient: 10 minutes. Over 50% of encounter was spent on counseling and coordination of care. Return in about 4 weeks (around 03/28/2018) for pp check.  Reva Bores 02/28/2018 8:40 AM

## 2018-03-14 ENCOUNTER — Ambulatory Visit: Payer: BLUE CROSS/BLUE SHIELD | Admitting: Obstetrics and Gynecology

## 2018-03-27 ENCOUNTER — Encounter: Payer: Self-pay | Admitting: Obstetrics

## 2018-03-27 ENCOUNTER — Ambulatory Visit (INDEPENDENT_AMBULATORY_CARE_PROVIDER_SITE_OTHER): Payer: BLUE CROSS/BLUE SHIELD | Admitting: Obstetrics

## 2018-03-27 DIAGNOSIS — Z30011 Encounter for initial prescription of contraceptive pills: Secondary | ICD-10-CM

## 2018-03-27 DIAGNOSIS — Z3009 Encounter for other general counseling and advice on contraception: Secondary | ICD-10-CM

## 2018-03-27 MED ORDER — NORETHINDRONE 0.35 MG PO TABS: 1.0000 | ORAL_TABLET | Freq: Every day | ORAL | 11 refills | Status: DC

## 2018-03-27 NOTE — Progress Notes (Signed)
Post Partum Exam  Bridget Bray is a 22 y.o. 521P1001 female who presents for a postpartum visit. She is 5 weeks postpartum following a low cervical transverse Cesarean section. I have fully reviewed the prenatal and intrapartum course. The delivery was at 7849w4d gestational weeks.  Anesthesia: epidural. Postpartum course has been UNREMARKABLE. Baby's course has been UNREMARKABLE. Baby is feeding by breast. Bleeding spotting only. Bowel function is normal. Bladder function is normal. Patient is not sexually active. Contraception method is none. Postpartum depression screening:neg  The following portions of the patient's history were reviewed and updated as appropriate: allergies, current medications, past family history, past medical history, past social history, past surgical history and problem list. Last pap smear done 3 / 2019 and was Normal  Review of Systems A comprehensive review of systems was negative.    Objective:  currently breastfeeding.  General:  alert and no distress   Breasts:  inspection negative, no nipple discharge or bleeding, no masses or nodularity palpable  Lungs: clear to auscultation bilaterally  Heart:  regular rate and rhythm, S1, S2 normal, no murmur, click, rub or gallop  Abdomen: soft, non-tender; bowel sounds normal; no masses,  no organomegaly and incision is clean, dry and intact   Assessment:    1. Postpartum care following cesarean delivery  2. Encounter for other general counseling and advice on contraception - wants OCP's  3. Encounter for initial prescription of contraceptive pills Rx: - norethindrone (MICRONOR,CAMILA,ERRIN) 0.35 MG tablet; Take 1 tablet (0.35 mg total) by mouth daily.  Dispense: 1 Package; Refill: 11  Plan:   1. Contraception: oral progesterone-only contraceptive 2. Continue PNV's 3. Follow up in: 6 months or as needed.    Brock BadHARLES A. HARPER MD 03-27-2018

## 2019-05-06 ENCOUNTER — Inpatient Hospital Stay (HOSPITAL_COMMUNITY)
Admission: AD | Admit: 2019-05-06 | Discharge: 2019-05-06 | Disposition: A | Discharge status: Home / Self Care | Payer: BC Managed Care – PPO | Attending: Obstetrics and Gynecology | Admitting: Obstetrics and Gynecology

## 2019-05-06 ENCOUNTER — Inpatient Hospital Stay (HOSPITAL_COMMUNITY): Payer: BC Managed Care – PPO

## 2019-05-06 ENCOUNTER — Other Ambulatory Visit: Payer: Self-pay

## 2019-05-06 ENCOUNTER — Encounter (HOSPITAL_COMMUNITY): Payer: Self-pay | Admitting: Obstetrics and Gynecology

## 2019-05-06 DIAGNOSIS — Z3A11 11 weeks gestation of pregnancy: Secondary | ICD-10-CM | POA: Insufficient documentation

## 2019-05-06 DIAGNOSIS — O209 Hemorrhage in early pregnancy, unspecified: Secondary | ICD-10-CM | POA: Diagnosis not present

## 2019-05-06 DIAGNOSIS — N939 Abnormal uterine and vaginal bleeding, unspecified: Secondary | ICD-10-CM | POA: Diagnosis not present

## 2019-05-06 DIAGNOSIS — Z3A08 8 weeks gestation of pregnancy: Secondary | ICD-10-CM | POA: Diagnosis not present

## 2019-05-06 DIAGNOSIS — O4690 Antepartum hemorrhage, unspecified, unspecified trimester: Secondary | ICD-10-CM

## 2019-05-06 DIAGNOSIS — O021 Missed abortion: Secondary | ICD-10-CM

## 2019-05-06 LAB — CBC
HCT: 39.4 % (ref 36.0–46.0)
Hemoglobin: 13.4 g/dL (ref 12.0–15.0)
MCH: 30 pg (ref 26.0–34.0)
MCHC: 34 g/dL (ref 30.0–36.0)
MCV: 88.1 fL (ref 80.0–100.0)
Platelets: 253 10*3/uL (ref 150–400)
RBC: 4.47 MIL/uL (ref 3.87–5.11)
RDW: 11.7 % (ref 11.5–15.5)
WBC: 7.8 10*3/uL (ref 4.0–10.5)
nRBC: 0 % (ref 0.0–0.2)

## 2019-05-06 LAB — URINALYSIS, ROUTINE W REFLEX MICROSCOPIC
Bilirubin Urine: NEGATIVE
Glucose, UA: NEGATIVE mg/dL
Ketones, ur: NEGATIVE mg/dL
Leukocytes,Ua: NEGATIVE
Nitrite: NEGATIVE
Protein, ur: NEGATIVE mg/dL
Specific Gravity, Urine: 1.011 (ref 1.005–1.030)
pH: 5 (ref 5.0–8.0)

## 2019-05-06 LAB — WET PREP, GENITAL
Clue Cells Wet Prep HPF POC: NONE SEEN
Sperm: NONE SEEN
Trich, Wet Prep: NONE SEEN
Yeast Wet Prep HPF POC: NONE SEEN

## 2019-05-06 LAB — HCG, QUANTITATIVE, PREGNANCY: hCG, Beta Chain, Quant, S: 2264 m[IU]/mL — ABNORMAL HIGH (ref <5)

## 2019-05-06 LAB — ABO/RH: ABO/RH(D): O POS

## 2019-05-06 LAB — POCT PREGNANCY, URINE: Preg Test, Ur: POSITIVE — AB

## 2019-05-06 NOTE — Discharge Instructions (Signed)
Return to care   If you have heavier bleeding that soaks through more that 2 pads per hour for an hour or more  If you bleed so much that you feel like you might pass out or you do pass out  If you have significant abdominal pain that is not improved with Tylenol   If you develop a fever > 100.5   Miscarriage A miscarriage is the loss of an unborn baby (fetus) before the 20th week of pregnancy. Most miscarriages happen during the first 3 months of pregnancy. Sometimes, a miscarriage can happen before a woman knows that she is pregnant. Having a miscarriage can be an emotional experience. If you have had a miscarriage, talk with your health care provider about any questions you may have about miscarrying, the grieving process, and your plans for future pregnancy. What are the causes? A miscarriage may be caused by:  Problems with the genes or chromosomes of the fetus. These problems make it impossible for the baby to develop normally. They are often the result of random errors that occur early in the development of the baby, and are not passed from parent to child (not inherited).  Infection of the cervix or uterus.  Conditions that affect hormone balance in the body.  Problems with the cervix, such as the cervix opening and thinning before pregnancy is at term (cervical insufficiency).  Problems with the uterus. These may include: ? A uterus with an abnormal shape. ? Fibroids in the uterus. ? Congenital abnormalities. These are problems that were present at birth.  Certain medical conditions.  Smoking, drinking alcohol, or using drugs.  Injury (trauma). In many cases, the cause of a miscarriage is not known. What are the signs or symptoms? Symptoms of this condition include:  Vaginal bleeding or spotting, with or without cramps or pain.  Pain or cramping in the abdomen or lower back.  Passing fluid, tissue, or blood clots from the vagina. How is this diagnosed? This  condition may be diagnosed based on:  A physical exam.  Ultrasound.  Blood tests.  Urine tests. How is this treated? Treatment for a miscarriage is sometimes not necessary if you naturally pass all the tissue that was in your uterus. If necessary, this condition may be treated with:  Dilation and curettage (D&C). This is a procedure in which the cervix is stretched open and the lining of the uterus (endometrium) is scraped. This is done only if tissue from the fetus or placenta remains in the body (incomplete miscarriage).  Medicines, such as: ? Antibiotic medicine, to treat infection. ? Medicine to help the body pass any remaining tissue. ? Medicine to reduce (contract) the size of the uterus. These medicines may be given if you have a lot of bleeding. If you have Rh negative blood and your baby was Rh positive, you will need a shot of a medicine called Rh immunoglobulinto protect your future babies from Rh blood problems. "Rh-negative" and "Rh-positive" refer to whether or not the blood has a specific protein found on the surface of red blood cells (Rh factor). Follow these instructions at home: Medicines   Take over-the-counter and prescription medicines only as told by your health care provider.  If you were prescribed antibiotic medicine, take it as told by your health care provider. Do not stop taking the antibiotic even if you start to feel better.  Do not take NSAIDs, such as aspirin and ibuprofen, unless they are approved by your health care provider. These  medicines can cause bleeding. Activity  Rest as directed. Ask your health care provider what activities are safe for you.  Have someone help with home and family responsibilities during this time. General instructions  Keep track of the number of sanitary pads you use each day and how soaked (saturated) they are. Write down this information.  Monitor the amount of tissue or blood clots that you pass from your vagina.  Save any large amounts of tissue for your health care provider to examine.  Do not use tampons, douche, or have sex until your health care provider approves.  To help you and your partner with the process of grieving, talk with your health care provider or seek counseling.  When you are ready, meet with your health care provider to discuss any important steps you should take for your health. Also, discuss steps you should take to have a healthy pregnancy in the future.  Keep all follow-up visits as told by your health care provider. This is important. Where to find more information  The American Congress of Obstetricians and Gynecologists: www.acog.org  U.S. Department of Health and Programmer, systems of Womens Health: [place] Contact a health care provider if:  You have a fever or chills.  You have a foul smelling vaginal discharge.  You have more bleeding instead of less. Get help right away if:  You have severe cramps or pain in your back or abdomen.  You pass blood clots or tissue from your vagina that is walnut-sized or larger.  You soak more than 1 regular sanitary pad in an hour.  You become light-headed or weak.  You pass out.  You have feelings of sadness that take over your thoughts, or you have thoughts of hurting yourself. Summary  Most miscarriages happen in the first 3 months of pregnancy. Sometimes miscarriage happens before a woman even knows that she is pregnant.  Follow your health care provider's instruction for home care. Keep all follow-up appointments.  To help you and your partner with the process of grieving, talk with your health care provider or seek counseling. This information is not intended to replace advice given to you by your health care provider. Make sure you discuss any questions you have with your health care provider. Document Revised: 08/10/2018 Document Reviewed: 05/24/2016 Elsevier Patient Education  [place].

## 2019-05-06 NOTE — MAU Provider Note (Signed)
Chief Complaint: Vaginal Bleeding   First Provider Initiated Contact with Patient 05/06/19 2111     SUBJECTIVE HPI: Bridget Bray is a 24 y.o. G2P1001 at [redacted]w[redacted]d who presents to [facility] reporting vaginal bleeding. Had a positive pregnancy test on 11/15. Her LMP was 10/18. Has not had any imaging or labs done yet during this pregnancy.  Bleeding started on 12/31. Initially was spotting. Has changed colors from pink to red. Had to wear a pad yesterday. Is not saturating pads. Has passed a few very small clots. Reports occasional abdominal cramping. Currently no pain.    Past Medical History:  Diagnosis Date  . Medical history non-contributory    OB History  Gravida Para Term Preterm AB Living  2 1 1     1   SAB TAB Ectopic Multiple Live Births        0 1    # Outcome Date GA Lbr Len/2nd Weight Sex Delivery Anes PTL Lv  2 Current           1 Term 02/14/18 [redacted]w[redacted]d  3450 g F CS-LTranv EPI  LIV   Past Surgical History:  Procedure Laterality Date  . CESAREAN SECTION N/A 02/14/2018   Procedure: CESAREAN SECTION;  Surgeon: 02/16/2018, MD;  Location: [facility];  Service: Obstetrics;  Laterality: N/A;  . TENDON REPAIR Right 09/05/2012   Procedure: NERVE AND TENDON REPAIR;  Surgeon: 11/05/2012, MD;  Location: [facility];  Service: Orthopedics;  Laterality: Right;  . WOUND EXPLORATION Right 09/05/2012   Procedure: WOUND EXPLORATION OF RIGHT WRIST;  Surgeon: 11/05/2012, MD;  Location: [facility] OR;  Service: Orthopedics;  Laterality: Right;   Social History   Socioeconomic History  . Marital status: Married    Spouse name: Not on file  . Number of children: Not on file  . Years of education: Not on file  . Highest education level: Not on file  Occupational History  . Not on file  Tobacco Use  . Smoking status: Never Smoker  . Smokeless tobacco: Never Used  Substance and Sexual Activity  . Alcohol use: No  . Drug use: No  . Sexual activity: Not Currently   Other Topics Concern  . Not on file  Social History Narrative   ** Merged History Encounter **       ** Merged History Encounter **       Social Determinants of Health   Financial Resource Strain:   . Difficulty of Paying Living Expenses: Not on file  Food Insecurity:   . Worried About Sharma Covert in the Last Year: Not on file  . Ran Out of Food in the Last Year: Not on file  Transportation Needs:   . Lack of Transportation (Medical): Not on file  . Lack of Transportation (Non-Medical): Not on file  Physical Activity:   . Days of Exercise per Week: Not on file  . Minutes of Exercise per Session: Not on file  Stress:   . Feeling of Stress : Not on file  Social Connections:   . Frequency of Communication with Friends and Family: Not on file  . Frequency of Social Gatherings with Friends and Family: Not on file  . Attends Religious Services: Not on file  . Active Member of Clubs or Organizations: Not on file  . Attends Programme researcher, broadcasting/film/video Meetings: Not on file  . Marital Status: Not on file  Intimate Partner Violence:   . Fear of Current or  Ex-Partner: Not on file  . Emotionally Abused: Not on file  . Physically Abused: Not on file  . Sexually Abused: Not on file   Family History  Problem Relation Age of Onset  . ADD / ADHD Neg Hx   . Alcohol abuse Neg Hx   . Anxiety disorder Neg Hx   . Arthritis Neg Hx   . Asthma Neg Hx   . Birth defects Neg Hx   . Cancer Neg Hx   . COPD Neg Hx   . Depression Neg Hx   . Diabetes Neg Hx   . Drug abuse Neg Hx   . Early death Neg Hx   . Hearing loss Neg Hx   . Heart disease Neg Hx   . Hyperlipidemia Neg Hx   . Hypertension Neg Hx   . Kidney disease Neg Hx   . Intellectual disability Neg Hx   . Learning disabilities Neg Hx   . Miscarriages / Stillbirths Neg Hx   . Stroke Neg Hx   . Obesity Neg Hx   . Vision loss Neg Hx   . Varicose Veins Neg Hx    No current facility-administered medications on file prior to  encounter.   Current Outpatient Medications on File Prior to Encounter  Medication Sig Dispense Refill  . ibuprofen (ADVIL,MOTRIN) 600 MG tablet Take 1 tablet (600 mg total) by mouth every 6 (six) hours. 30 tablet 0  . norethindrone (MICRONOR,CAMILA,ERRIN) 0.35 MG tablet Take 1 tablet (0.35 mg total) by mouth daily. 1 Package 11  . oxyCODONE-acetaminophen (PERCOCET/ROXICET) 5-325 MG tablet Take 1-2 tablets by mouth every 6 (six) hours as needed. 20 tablet 0  . Prenatal Vit-Fe Fumarate-FA (PRENATAL MULTIVITAMIN) TABS tablet Take 1 tablet by mouth daily at 12 noon.    . Vitamin D, Ergocalciferol, (DRISDOL) 50000 units CAPS capsule Take 1 capsule (50,000 Units total) by mouth every 7 (seven) days. (Patient taking differently: Take 50,000 Units by mouth every 7 (seven) days. Saturday) 30 capsule 2   No Known Allergies  I have reviewed patient's Past Medical Hx, Surgical Hx, Family Hx, Social Hx, medications and allergies.   Review of Systems  Constitutional: Negative.   Gastrointestinal: Negative.   Genitourinary: Positive for vaginal bleeding.    OBJECTIVE Patient Vitals for the past 24 hrs:  BP Temp Pulse Resp SpO2 Height Weight  05/06/19 1826 101/67 98 F (36.7 C) 66 16 100 % 4\' 11"  (1.499 m) 62.6 kg   Constitutional: Well-developed, well-nourished female in no acute distress.  Cardiovascular: normal rate & rhythm, no murmur Respiratory: normal rate and effort. Lung sounds clear throughout GI: Abd soft, non-tender, Pos BS x 4. No guarding or rebound tenderness MS: Extremities nontender, no edema, normal ROM Neurologic: Alert and oriented x 4.  GU:     SPECULUM EXAM: NEFG, small amount of dark red blood coming from os  BIMANUAL: No CMT. cervix closed; uterus normal size, no adnexal tenderness or masses.    LAB RESULTS Results for orders placed or performed during the hospital encounter of 05/06/19 (from the past 24 hour(s))  Pregnancy, urine POC     Status: Abnormal   Collection  Time: 05/06/19  6:49 PM  Result Value Ref Range   Preg Test, Ur POSITIVE (A) NEGATIVE  Wet prep, genital     Status: Abnormal   Collection Time: 05/06/19  7:07 PM  Result Value Ref Range   Yeast Wet Prep HPF POC NONE SEEN NONE SEEN   Trich, Wet Prep NONE SEEN  NONE SEEN   Clue Cells Wet Prep HPF POC NONE SEEN NONE SEEN   WBC, Wet Prep HPF POC MODERATE (A) NONE SEEN   Sperm NONE SEEN   Urinalysis, Routine w reflex microscopic     Status: Abnormal   Collection Time: 05/06/19  7:25 PM  Result Value Ref Range   Color, Urine STRAW (A) YELLOW   APPearance HAZY (A) CLEAR   Specific Gravity, Urine 1.011 1.005 - 1.030   pH 5.0 5.0 - 8.0   Glucose, UA NEGATIVE NEGATIVE mg/dL   Hgb urine dipstick LARGE (A) NEGATIVE   Bilirubin Urine NEGATIVE NEGATIVE   Ketones, ur NEGATIVE NEGATIVE mg/dL   Protein, ur NEGATIVE NEGATIVE mg/dL   Nitrite NEGATIVE NEGATIVE   Leukocytes,Ua NEGATIVE NEGATIVE   RBC / HPF 0-5 0 - 5 RBC/hpf   WBC, UA 0-5 0 - 5 WBC/hpf   Bacteria, UA RARE (A) NONE SEEN   Squamous Epithelial / LPF 0-5 0 - 5   Mucus PRESENT   CBC     Status: None   Collection Time: 05/06/19  7:27 PM  Result Value Ref Range   WBC 7.8 4.0 - 10.5 K/uL   RBC 4.47 3.87 - 5.11 MIL/uL   Hemoglobin 13.4 12.0 - 15.0 g/dL   HCT 40.9 81.1 - 91.4 %   MCV 88.1 80.0 - 100.0 fL   MCH 30.0 26.0 - 34.0 pg   MCHC 34.0 30.0 - 36.0 g/dL   RDW 78.2 95.6 - 21.3 %   Platelets 253 150 - 400 K/uL   nRBC 0.0 0.0 - 0.2 %  ABO/Rh     Status: None   Collection Time: 05/06/19  7:27 PM  Result Value Ref Range   ABO/RH(D) O POS    No rh immune globuloin      NOT A RH IMMUNE GLOBULIN CANDIDATE, PT RH POSITIVE Performed at [facility], [address] [place], [place] [address]   hCG, quantitative, pregnancy     Status: Abnormal   Collection Time: 05/06/19  7:27 PM  Result Value Ref Range   hCG, Beta Chain, Quant, S 2,264 (H) <5 mIU/mL    IMAGING US OB LESS THAN 14 WEEKS WITH OB TRANSVAGINAL  Result Date:  05/06/2019 CLINICAL DATA:  Vaginal bleeding EXAM: OBSTETRIC <14 WK [place] AND TRANSVAGINAL OB US TECHNIQUE: Both transabdominal and transvaginal ultrasound examinations were performed for complete evaluation of the gestation as well as the maternal uterus, adnexal regions, and pelvic cul-de-sac. Transvaginal technique was performed to assess early pregnancy. COMPARISON:  None recent FINDINGS: Intrauterine gestational sac: Single Yolk sac:  Not Visualized. Embryo:  Visualized. Cardiac Activity: Not Visualized. CRL: 19.5 mm   8 w   3 d                  [place] EDC: 12/13/2019 Subchorionic hemorrhage:  None visualized. Maternal uterus/adnexae: The IMPRESSION: 1. Single IUP at 8 weeks and 3 days without evidence for cardiac activity. Findings meet definitive criteria for failed pregnancy. This follows SRU consensus guidelines: Diagnostic Criteria for Nonviable Pregnancy Early in the First Trimester. [place] Mis J Med [phone]. 2. No maternal abnormality detected. Electronically Signed   By: Katherine Mantle M.D.   On: 05/06/2019 22:08    MAU COURSE Orders Placed This Encounter  Procedures  . Wet prep, genital  . US OB LESS THAN 14 WEEKS WITH OB TRANSVAGINAL  . Urinalysis, Routine w reflex microscopic  . CBC  . hCG, quantitative, pregnancy  . Pregnancy,  urine POC  . ABO/Rh  . Discharge patient   No orders of the defined types were placed in this encounter.   MDM +UPT UA, wet prep, GC/chlamydia, CBC, ABO/Rh, quant hCG, and [place] today to rule out ectopic pregnancy which can be life threatening.   RH positive  Ultrasound shows IUP measuring [redacted]w[redacted]d with no cardiac activity  Discussed options for management of incomplete AB including expectant management, Cytotec or D&C. Prefers expectant management at this time. Verbalizes understanding that intervention may become necessary if SAB in not completed spontaneously or if heavy bleeding or infection occur.   ASSESSMENT 1. Missed abortion   2. Vaginal  bleeding during pregnancy     PLAN Discharge home in stable condition. Infection & bleeding precautions Message sent to [facility] for SAB f/u appointment  Follow-up [facility] Follow up.   Specialty: Obstetrics and Gynecology Why: the office will call you to schedule your follow up appointment Contact information: [address] [place] [facility] Follow up.   Specialty: Obstetrics and Gynecology Why: return for worsening symptoms Contact information: [address] [id number] [place] [phone]         Allergies as of 05/06/2019   No Known Allergies     Medication List    STOP taking these medications   ibuprofen 600 MG tablet Commonly known as: ADVIL   norethindrone 0.35 MG tablet Commonly known as: MICRONOR   oxyCODONE-acetaminophen 5-325 MG tablet Commonly known as: PERCOCET/ROXICET     TAKE these medications   prenatal multivitamin Tabs tablet Take 1 tablet by mouth daily at 12 noon.   Vitamin D (Ergocalciferol) 1.25 MG (50000 UT) Caps capsule Commonly known as: DRISDOL Take 1 capsule (50,000 Units total) by mouth every 7 (seven) days. What changed: additional instructions        Jorje Guild, NP 05/06/2019  10:38 PM

## 2019-05-06 NOTE — MAU Note (Signed)
.   Bridget Bray is a 24 y.o.  here in MAU reporting: [facility]. States she started having vaginal bleeding on Dec 31st that seems to be getting heavier. Lower abdominal cramping LMP: 02/11/19 Onset of complaint: dec 31 Pain score: 3 Vitals:   05/06/19 1826  BP: 101/67  Pulse: 66  Resp: 16  Temp: 98 F (36.7 C)  SpO2: 100%     FHT: Lab orders placed from triage: UA/UPT

## 2019-05-06 NOTE — ED Triage Notes (Signed)
Pt. Stated, Im [redacted] weeks pregnant, confirmed a [facility].

## 2019-05-07 ENCOUNTER — Other Ambulatory Visit: Payer: Self-pay

## 2019-05-07 ENCOUNTER — Encounter (HOSPITAL_COMMUNITY): Payer: Self-pay | Admitting: Family Medicine

## 2019-05-07 ENCOUNTER — Inpatient Hospital Stay (HOSPITAL_COMMUNITY)
Admission: AD | Admit: 2019-05-07 | Discharge: 2019-05-08 | Disposition: A | Discharge status: Home / Self Care | Payer: BC Managed Care – PPO | Attending: Family Medicine | Admitting: Family Medicine

## 2019-05-07 ENCOUNTER — Inpatient Hospital Stay (HOSPITAL_COMMUNITY): Payer: BC Managed Care – PPO

## 2019-05-07 DIAGNOSIS — O039 Complete or unspecified spontaneous abortion without complication: Secondary | ICD-10-CM | POA: Diagnosis not present

## 2019-05-07 DIAGNOSIS — O26891 Other specified pregnancy related conditions, first trimester: Secondary | ICD-10-CM | POA: Diagnosis not present

## 2019-05-07 DIAGNOSIS — R109 Unspecified abdominal pain: Secondary | ICD-10-CM | POA: Diagnosis not present

## 2019-05-07 DIAGNOSIS — Z3A11 11 weeks gestation of pregnancy: Secondary | ICD-10-CM | POA: Insufficient documentation

## 2019-05-07 DIAGNOSIS — O209 Hemorrhage in early pregnancy, unspecified: Secondary | ICD-10-CM

## 2019-05-07 LAB — GC/CHLAMYDIA PROBE AMP (~~LOC~~) NOT AT ARMC
Chlamydia: NEGATIVE
Comment: NEGATIVE
Comment: NORMAL
Neisseria Gonorrhea: NEGATIVE

## 2019-05-07 NOTE — MAU Note (Signed)
PT SAYS SHE STARTED BLEEDING ON 12-21.  SHE WAS HERE YESTERDAY - SAID  BABY HAD STOPPED GROWING.    TONIGHT AT 630PM-  THE  BABY CAME OUT .  Marland Kitchen BROUGHT FROM  LOBBY TO ROOM.  ALSO FEELS CRAMPS - LESS THAN AT HOME .  BLEEDING- HAS TOILET PAPER IN UMDERWEAR- RED BLOOD.

## 2019-05-07 NOTE — MAU Provider Note (Signed)
Chief Complaint: Vaginal Bleeding   First Provider Initiated Contact with Patient 05/07/19 2322        SUBJECTIVE HPI: Bridget Bray is a 24 y.o. G2P1001 at [redacted]w[redacted]d by LMP who presents to maternity admissions reporting bleeding and cramping resulting in passing her 8 wk fetus at home.  Now cramping and bleeding have lessened.  Seen yesterday and diagnosed with missed abortion (11 wk by dates with 8 wk fetus with no cardiac activity).  She denies vaginal bleeding, vaginal itching/burning, urinary symptoms, h/a, dizziness, n/v, or fever/chills.    Vaginal Bleeding The patient's primary symptoms include pelvic pain and vaginal bleeding. The patient's pertinent negatives include no genital itching, genital lesions or genital odor. This is a new problem. The current episode started 1 to 4 weeks ago. The problem occurs constantly. The problem has been gradually improving. The pain is mild. Associated symptoms include abdominal pain. Pertinent negatives include no back pain, chills or fever. The vaginal discharge was bloody. The vaginal bleeding is typical of menses. She has been passing clots. She has not been passing tissue. Nothing aggravates the symptoms. She has tried acetaminophen for the symptoms. The treatment provided mild relief.   RN Note: PT SAYS SHE STARTED BLEEDING ON 12-21.  SHE WAS HERE YESTERDAY - SAID  BABY HAD STOPPED GROWING.    TONIGHT AT 630PM-  THE  BABY CAME OUT .  Marland Kitchen BROUGHT FROM  LOBBY TO ROOM.  ALSO FEELS CRAMPS - LESS THAN AT HOME .  BLEEDING- HAS TOILET PAPER IN UMDERWEAR- RED BLOOD  Past Medical History:  Diagnosis Date  . Medical history non-contributory    Past Surgical History:  Procedure Laterality Date  . CESAREAN SECTION N/A 02/14/2018   Procedure: CESAREAN SECTION;  Surgeon: Tereso Newcomer, MD;  [facility]: [facility];  Service: Obstetrics;  Laterality: N/A;  . TENDON REPAIR Right 09/05/2012   Procedure: NERVE AND TENDON REPAIR;  Surgeon: Sharma Covert, MD;  Location: [facility];  Service: Orthopedics;  Laterality: Right;  . WOUND EXPLORATION Right 09/05/2012   Procedure: WOUND EXPLORATION OF RIGHT WRIST;  Surgeon: Sharma Covert, MD;  Location: [facility];  Service: Orthopedics;  Laterality: Right;   Social History   Socioeconomic History  . Marital status: Married    Spouse name: Not on file  . Number of children: Not on file  . Years of education: Not on file  . Highest education level: Not on file  Occupational History  . Not on file  Tobacco Use  . Smoking status: Never Smoker  . Smokeless tobacco: Never Used  Substance and Sexual Activity  . Alcohol use: No  . Drug use: No  . Sexual activity: Yes  Other Topics Concern  . Not on file  Social History Narrative   ** Merged History Encounter **       ** Merged History Encounter **       Social Determinants of Health   Financial Resource Strain:   . Difficulty of Paying Living Expenses: Not on file  Food Insecurity:   . Worried About Programme researcher, broadcasting/film/video in the Last Year: Not on file  . Ran Out of Food in the Last Year: Not on file  Transportation Needs:   . Lack of Transportation (Medical): Not on file  . Lack of Transportation (Non-Medical): Not on file  Physical Activity:   . Days of Exercise per Week: Not on file  . Minutes of Exercise per Session: Not on file  Stress:   . Feeling of Stress : Not on file  Social Connections:   . Frequency of Communication with Friends and Family: Not on file  . Frequency of Social Gatherings with Friends and Family: Not on file  . Attends Religious Services: Not on file  . Active Member of Clubs or Organizations: Not on file  . Attends Archivist Meetings: Not on file  . Marital Status: Not on file  Intimate Partner Violence:   . Fear of Current or Ex-Partner: Not on file  . Emotionally Abused: Not on file  . Physically Abused: Not on file  . Sexually Abused: Not on file   No current facility-administered  medications on file prior to encounter.   Current Outpatient Medications on File Prior to Encounter  Medication Sig Dispense Refill  . Prenatal Vit-Fe Fumarate-FA (PRENATAL MULTIVITAMIN) TABS tablet Take 1 tablet by mouth daily at 12 noon.    . Vitamin D, Ergocalciferol, (DRISDOL) 50000 units CAPS capsule Take 1 capsule (50,000 Units total) by mouth every 7 (seven) days. (Patient taking differently: Take 50,000 Units by mouth every 7 (seven) days. Saturday) 30 capsule 2   No Known Allergies  I have reviewed patient's Past Medical Hx, Surgical Hx, Family Hx, Social Hx, medications and allergies.   ROS:  Review of Systems  Constitutional: Negative for chills and fever.  Gastrointestinal: Positive for abdominal pain.  Genitourinary: Positive for pelvic pain and vaginal bleeding.  Musculoskeletal: Negative for back pain.   Review of Systems  Other systems negative   Physical Exam  Physical Exam Patient Vitals for the past 24 hrs:  BP Temp Temp src Pulse Resp  05/07/19 2313 107/72 98.6 F (37 C) Oral 83 12   Constitutional: Well-developed, well-nourished female in no acute distress.  Cardiovascular: normal rate Respiratory: normal effort GI: Abd soft, non-tender. Pos BS x 4 MS: Extremities nontender, no edema, normal ROM Neurologic: Alert and oriented x 4.  GU: Neg CVAT.  PELVIC EXAM: Full pelvic exam deferred                           Small to moderate red blood on pad, no hemorrhage   LAB RESULTS No results found for this or any previous visit (from the past 24 hour(s)).   Ref. Range 05/06/2019 19:27  Hemoglobin Latest Ref Range: 12.0 - 15.0 g/dL 13.4   --/--/O POS (01/04 1927)  IMAGING US OB Comp Less 14 Wks  Result Date: 05/08/2019 CLINICAL DATA:  24 year old female with recent spontaneous abortion. Evaluate for retained products of conception. EXAM: OBSTETRIC <14 WK ULTRASOUND TECHNIQUE: Transabdominal ultrasound was performed for evaluation of the gestation as well  as the maternal uterus and adnexal regions. COMPARISON:  Ultrasound dated 05/06/2019. FINDINGS: The uterus is anteverted and appears unremarkable. The endometrium measures up to 8 mm in thickness. No intrauterine pregnancy identified in keeping with recent spontaneous abortion. There is increased vascularity within the myometrium. However, no significant vascularity noted within the endometrium. The right ovary measures 3.1 x 1.5 x 3.3 cm and the left ovary measures 2.3 x 1.1 x 1.7 cm. No free fluid within the pelvis. IMPRESSION: 1. No intrauterine pregnancy identified in keeping with recent spontaneous miscarriage. 2. No definite sonographic findings of retained products of conception. Correlation with clinical exam and serial HCG levels and follow-up with ultrasound as clinically indicated recommended. Electronically Signed   By: Anner Crete M.D.   On: 05/08/2019 00:16  MAU Management/MDM: Exam and history indicate passage of entire pregnancy Bleeding is small to moderate Will get [place] to confirm no retained products of conception States Tylenol helped cramping somewhate.  wIll recommend ibuprofen   ASSESSMENT Complete spontaneous abortion   PLAN Discharge home Bleeding precautions Note for work given Followup in office  Pt stable at time of discharge. Encouraged to return here or to other Urgent Care/ED if she develops worsening of symptoms, increase in pain, fever, or other concerning symptoms.    Wynelle Bourgeois CNM, MSN Certified Nurse-Midwife 05/07/2019  11:22 PM

## 2019-05-08 ENCOUNTER — Telehealth (INDEPENDENT_AMBULATORY_CARE_PROVIDER_SITE_OTHER): Payer: BC Managed Care – PPO | Admitting: Certified Nurse Midwife

## 2019-05-08 ENCOUNTER — Encounter: Payer: Self-pay | Admitting: Certified Nurse Midwife

## 2019-05-08 DIAGNOSIS — Z3A11 11 weeks gestation of pregnancy: Secondary | ICD-10-CM | POA: Diagnosis not present

## 2019-05-08 DIAGNOSIS — O039 Complete or unspecified spontaneous abortion without complication: Secondary | ICD-10-CM | POA: Diagnosis not present

## 2019-05-08 DIAGNOSIS — Z3A00 Weeks of gestation of pregnancy not specified: Secondary | ICD-10-CM | POA: Diagnosis not present

## 2019-05-08 DIAGNOSIS — Z3689 Encounter for other specified antenatal screening: Secondary | ICD-10-CM | POA: Diagnosis not present

## 2019-05-08 DIAGNOSIS — R109 Unspecified abdominal pain: Secondary | ICD-10-CM | POA: Diagnosis not present

## 2019-05-08 DIAGNOSIS — O26891 Other specified pregnancy related conditions, first trimester: Secondary | ICD-10-CM | POA: Diagnosis not present

## 2019-05-08 MED ORDER — IBUPROFEN 600 MG PO TABS: 600.0000 mg | ORAL_TABLET | Freq: Four times a day (QID) | ORAL | 1 refills | Status: DC | PRN

## 2019-05-08 NOTE — Discharge Instructions (Signed)

## 2019-05-08 NOTE — Progress Notes (Signed)
TELEHEALTH GYNECOLOGY VIRTUAL VIDEO VISIT ENCOUNTER NOTE  Provider location: [facility] at [place]   I connected with Bridget Bray on 05/08/19 at  3:40 PM EST by MyChart Video Encounter at home and verified that I am speaking with the correct person using two identifiers.   I discussed the limitations, risks, security and privacy concerns of performing an evaluation and management service virtually and the availability of in person appointments. I also discussed with the patient that there may be a patient responsible charge related to this service. The patient expressed understanding and agreed to proceed.   History:  Bridget Bray is a 24 y.o. G57P1001 female being evaluated today for questions about recent miscarriage. She was seen in MAU on 1/4 and diagnosed with a missed abortion, started bleeding last night and passed POC then went back to MAU for evaluation. [place] confirms complete miscarriage.      Past Medical History:  Diagnosis Date  . Medical history non-contributory    Past Surgical History:  Procedure Laterality Date  . CESAREAN SECTION N/A 02/14/2018   Procedure: CESAREAN SECTION;  Surgeon: Osborne Oman, MD;  Location: [place];  Service: Obstetrics;  Laterality: N/A;  . TENDON REPAIR Right 09/05/2012   Procedure: NERVE AND TENDON REPAIR;  Surgeon: Linna Hoff, MD;  Location: [place];  Service: Orthopedics;  Laterality: Right;  . WOUND EXPLORATION Right 09/05/2012   Procedure: WOUND EXPLORATION OF RIGHT WRIST;  Surgeon: Linna Hoff, MD;  Location: [place];  Service: Orthopedics;  Laterality: Right;   The following portions of the patient's history were reviewed and updated as appropriate: allergies, current medications, past family history, past medical history, past social history, past surgical history and problem list.    Review of Systems:  Pertinent items noted in HPI and remainder of comprehensive ROS otherwise  negative.  Physical Exam:   General:  Alert, oriented and cooperative. Patient appears to be in no acute distress.  Mental Status: Normal mood and affect. Normal behavior. Normal judgment and thought content.   Respiratory: Normal respiratory effort, no problems with respiration noted  Rest of physical exam deferred due to type of encounter  Labs and Imaging Results for orders placed or performed during the hospital encounter of 05/06/19 (from the past 336 hour(s))  Pregnancy, urine POC   Collection Time: 05/06/19  6:49 PM  Result Value Ref Range   Preg Test, Ur POSITIVE (A) NEGATIVE  Wet prep, genital   Collection Time: 05/06/19  7:07 PM  Result Value Ref Range   Yeast Wet Prep HPF POC NONE SEEN NONE SEEN   Trich, Wet Prep NONE SEEN NONE SEEN   Clue Cells Wet Prep HPF POC NONE SEEN NONE SEEN   WBC, Wet Prep HPF POC MODERATE (A) NONE SEEN   Sperm NONE SEEN   GC/Chlamydia probe amp ([facility]not at [facility]   Collection Time: 05/06/19  7:11 PM  Result Value Ref Range   Neisseria Gonorrhea Negative    Chlamydia Negative    Comment Normal Reference Ranger Chlamydia - Negative    Comment      Normal Reference Range Neisseria Gonorrhea - Negative  Urinalysis, Routine w reflex microscopic   Collection Time: 05/06/19  7:25 PM  Result Value Ref Range   Color, Urine STRAW (A) YELLOW   APPearance HAZY (A) CLEAR   Specific Gravity, Urine 1.011 1.005 - 1.030   pH 5.0 5.0 - 8.0   Glucose, UA NEGATIVE NEGATIVE  mg/dL   Hgb urine dipstick LARGE (A) NEGATIVE   Bilirubin Urine NEGATIVE NEGATIVE   Ketones, ur NEGATIVE NEGATIVE mg/dL   Protein, ur NEGATIVE NEGATIVE mg/dL   Nitrite NEGATIVE NEGATIVE   Leukocytes,Ua NEGATIVE NEGATIVE   RBC / HPF 0-5 0 - 5 RBC/hpf   WBC, UA 0-5 0 - 5 WBC/hpf   Bacteria, UA RARE (A) NONE SEEN   Squamous Epithelial / LPF 0-5 0 - 5   Mucus PRESENT   CBC   Collection Time: 05/06/19  7:27 PM  Result Value Ref Range   WBC 7.8 4.0 - 10.5 K/uL   RBC 4.47 3.87 -  5.11 MIL/uL   Hemoglobin 13.4 12.0 - 15.0 g/dL   HCT 10.6 26.9 - 48.5 %   MCV 88.1 80.0 - 100.0 fL   MCH 30.0 26.0 - 34.0 pg   MCHC 34.0 30.0 - 36.0 g/dL   RDW 46.2 70.3 - 50.0 %   Platelets 253 150 - 400 K/uL   nRBC 0.0 0.0 - 0.2 %  hCG, quantitative, pregnancy   Collection Time: 05/06/19  7:27 PM  Result Value Ref Range   hCG, Beta Chain, Quant, S 2,264 (H) <5 mIU/mL  ABO/Rh   Collection Time: 05/06/19  7:27 PM  Result Value Ref Range   ABO/RH(D) O POS    No rh immune globuloin      NOT A RH IMMUNE GLOBULIN CANDIDATE, PT RH POSITIVE Performed at [facility], [address], [place], [place] [address]    [place] Comp Less 14 Wks  Result Date: 05/08/2019 CLINICAL DATA:  24 year old female with recent spontaneous abortion. Evaluate for retained products of conception. EXAM: OBSTETRIC <14 WK ULTRASOUND TECHNIQUE: Transabdominal ultrasound was performed for evaluation of the gestation as well as the maternal uterus and adnexal regions. COMPARISON:  Ultrasound dated 05/06/2019. FINDINGS: The uterus is anteverted and appears unremarkable. The endometrium measures up to 8 mm in thickness. No intrauterine pregnancy identified in keeping with recent spontaneous abortion. There is increased vascularity within the myometrium. However, no significant vascularity noted within the endometrium. The right ovary measures 3.1 x 1.5 x 3.3 cm and the left ovary measures 2.3 x 1.1 x 1.7 cm. No free fluid within the pelvis. IMPRESSION: 1. No intrauterine pregnancy identified in keeping with recent spontaneous miscarriage. 2. No definite sonographic findings of retained products of conception. Correlation with clinical exam and serial HCG levels and follow-up with ultrasound as clinically indicated recommended. Electronically Signed   By: Elgie Collard M.D.   On: 05/08/2019 00:16   US OB LESS THAN 14 WEEKS WITH OB TRANSVAGINAL  Result Date: 05/06/2019 CLINICAL DATA:  Vaginal bleeding EXAM: OBSTETRIC <14  WK [place] AND TRANSVAGINAL OB US TECHNIQUE: Both transabdominal and transvaginal ultrasound examinations were performed for complete evaluation of the gestation as well as the maternal uterus, adnexal regions, and pelvic cul-de-sac. Transvaginal technique was performed to assess early pregnancy. COMPARISON:  None recent FINDINGS: Intrauterine gestational sac: Single Yolk sac:  Not Visualized. Embryo:  Visualized. Cardiac Activity: Not Visualized. CRL: 19.5 mm   8 w   3 d                  [place] EDC: 12/13/2019 Subchorionic hemorrhage:  None visualized. Maternal uterus/adnexae: The IMPRESSION: 1. Single IUP at 8 weeks and 3 days without evidence for cardiac activity. Findings meet definitive criteria for failed pregnancy. This follows SRU consensus guidelines: Diagnostic Criteria for Nonviable Pregnancy Early in the First Trimester. N Engl  J Med [phone]. 2. No maternal abnormality detected. Electronically Signed   By: Katherine Mantle M.D.   On: 05/06/2019 22:08       Assessment and Plan:     1. SAB (spontaneous abortion) - Educated and discussed with patient on what to expect with vaginal bleeding - patient reports she is bleeding slightly heavier than a cycle but denies passing any clots since recent visit in MAU  - Reports occasional abdominal cramping - rates 2/10 - has ibuprofen prescribed but has not taken, encouraged patient to take ibuprofen or tylenol as needed for pain  - Discussed with patient that she can continue to work from home - denies being lightheaded or dizzy  - Discussed with patient to abstain from extraneous activity, heavy lifting, exercise and intercourse - patient verbalizes understanding  - Patient concerned that she can miscarry in future pregnancy. Educated and discussed risk of miscarriage in the first trimester with patient and husband  - Answered patient's questions   Follow up in 2 weeks as scheduled        I discussed the assessment and treatment plan with the  patient. The patient was provided an opportunity to ask questions and all were answered. The patient agreed with the plan and demonstrated an understanding of the instructions.   The patient was advised to call back or seek an in-person evaluation/go to the ED if the symptoms worsen or if the condition fails to improve as anticipated.  I provided 11 minutes of face-to-face time during this encounter.   Sharyon Cable, CNM [facility] [employer], [facility]

## 2019-05-13 ENCOUNTER — Telehealth: Payer: Self-pay

## 2019-05-13 NOTE — Telephone Encounter (Signed)
Pt called and would like to know if she can have a note for work saying that she does not have to travel and can only work from home currently due to recent miscarriage. I advised her that I would route her concerns to the provider, pt verbalizes understanding.

## 2019-05-20 ENCOUNTER — Other Ambulatory Visit: Payer: Self-pay

## 2019-05-20 ENCOUNTER — Encounter: Payer: Self-pay | Admitting: Advanced Practice Midwife

## 2019-05-20 ENCOUNTER — Ambulatory Visit (INDEPENDENT_AMBULATORY_CARE_PROVIDER_SITE_OTHER): Payer: BC Managed Care – PPO | Admitting: Advanced Practice Midwife

## 2019-05-20 VITALS — BP 101/72 | HR 69 | Ht 59.0 in | Wt 137.0 lb

## 2019-05-20 DIAGNOSIS — O039 Complete or unspecified spontaneous abortion without complication: Secondary | ICD-10-CM | POA: Insufficient documentation

## 2019-05-20 NOTE — Progress Notes (Signed)
  GYNECOLOGY PROGRESS NOTE  History:  24 y.o. G2P1001 presents to [facility] office today for follow up visit for SAB. She reports light bleeding without cramping today.  She was initially seen on 05/06/19 for light bleeding and had US showing 8 week IUP without FHR so missed ab was diagnosed. She returned to MAU 05/08/19 with heavier bleeding and no pregnancy was seen in [place], confirming SAB.  She denies h/a, dizziness, shortness of breath, n/v, or fever/chills.    The following portions of the patient's history were reviewed and updated as appropriate: allergies, current medications, past family history, past medical history, past social history, past surgical history and problem list. Last pap smear on 07/25/17 was normal.  Review of Systems:  Pertinent items are noted in HPI.   Objective:  Physical Exam Blood pressure 101/72, pulse 69, height 4\' 11"  (1.499 m), weight 137 lb (62.1 kg), last menstrual period 02/17/2019, currently breastfeeding. VS reviewed, nursing note reviewed,  Constitutional: well developed, well nourished, no distress HEENT: normocephalic CV: normal rate Pulm/chest wall: normal effort Breast Exam: deferred Abdomen: soft Neuro: alert and oriented x 3 Skin: warm, dry Psych: affect normal Pelvic exam: Deferred  Assessment & Plan:  1. SAB (spontaneous abortion) --There are no obvious reasons for SAB, discussed how common miscarriage is and that there are little risks at this point to future pregnancies.  Pt had healthy term pregnancy so this is reassuring. --She is unsure about planning another pregnancy vs contraception and will call the office if she desires to start contraception. --Hcg was 2000+ on 05/06/19, will recheck for appropriate decrease. - B-HCG Quant   07/04/19, CNM 2:07 PM

## 2019-05-21 LAB — BETA HCG QUANT (REF LAB): hCG Quant: 12 m[IU]/mL

## 2019-06-14 ENCOUNTER — Other Ambulatory Visit: Payer: Self-pay

## 2019-06-14 ENCOUNTER — Other Ambulatory Visit: Payer: BC Managed Care – PPO

## 2019-06-14 DIAGNOSIS — O039 Complete or unspecified spontaneous abortion without complication: Secondary | ICD-10-CM

## 2019-06-15 LAB — BETA HCG QUANT (REF LAB): hCG Quant: 2 m[IU]/mL

## 2019-09-11 DIAGNOSIS — N946 Dysmenorrhea, unspecified: Secondary | ICD-10-CM | POA: Diagnosis not present

## 2019-12-06 DIAGNOSIS — Z6830 Body mass index (BMI) 30.0-30.9, adult: Secondary | ICD-10-CM | POA: Diagnosis not present

## 2019-12-06 DIAGNOSIS — Z3689 Encounter for other specified antenatal screening: Secondary | ICD-10-CM | POA: Diagnosis not present

## 2019-12-06 DIAGNOSIS — Z3200 Encounter for pregnancy test, result unknown: Secondary | ICD-10-CM | POA: Diagnosis not present

## 2019-12-06 DIAGNOSIS — R102 Pelvic and perineal pain: Secondary | ICD-10-CM | POA: Diagnosis not present

## 2019-12-06 DIAGNOSIS — Z3201 Encounter for pregnancy test, result positive: Secondary | ICD-10-CM | POA: Diagnosis not present

## 2019-12-10 DIAGNOSIS — R102 Pelvic and perineal pain: Secondary | ICD-10-CM | POA: Diagnosis not present

## 2019-12-12 DIAGNOSIS — R102 Pelvic and perineal pain: Secondary | ICD-10-CM | POA: Diagnosis not present

## 2019-12-30 DIAGNOSIS — Z3A08 8 weeks gestation of pregnancy: Secondary | ICD-10-CM | POA: Diagnosis not present

## 2019-12-30 DIAGNOSIS — O3680X0 Pregnancy with inconclusive fetal viability, not applicable or unspecified: Secondary | ICD-10-CM | POA: Diagnosis not present

## 2019-12-31 DIAGNOSIS — J3489 Other specified disorders of nose and nasal sinuses: Secondary | ICD-10-CM | POA: Diagnosis not present

## 2019-12-31 DIAGNOSIS — R110 Nausea: Secondary | ICD-10-CM | POA: Diagnosis not present

## 2019-12-31 DIAGNOSIS — R0981 Nasal congestion: Secondary | ICD-10-CM | POA: Diagnosis not present

## 2020-01-17 DIAGNOSIS — Z124 Encounter for screening for malignant neoplasm of cervix: Secondary | ICD-10-CM | POA: Diagnosis not present

## 2020-01-17 DIAGNOSIS — Z3481 Encounter for supervision of other normal pregnancy, first trimester: Secondary | ICD-10-CM | POA: Diagnosis not present

## 2020-01-17 DIAGNOSIS — Z3689 Encounter for other specified antenatal screening: Secondary | ICD-10-CM | POA: Diagnosis not present

## 2020-01-17 LAB — OB RESULTS CONSOLE RUBELLA ANTIBODY, IGM: Rubella: IMMUNE

## 2020-01-17 LAB — OB RESULTS CONSOLE GC/CHLAMYDIA
Chlamydia: NEGATIVE
Gonorrhea: NEGATIVE

## 2020-01-17 LAB — OB RESULTS CONSOLE HIV ANTIBODY (ROUTINE TESTING): HIV: NONREACTIVE

## 2020-01-17 LAB — OB RESULTS CONSOLE RPR: RPR: NONREACTIVE

## 2020-01-17 LAB — OB RESULTS CONSOLE HEPATITIS B SURFACE ANTIGEN: Hepatitis B Surface Ag: NEGATIVE

## 2020-02-13 DIAGNOSIS — Z3482 Encounter for supervision of other normal pregnancy, second trimester: Secondary | ICD-10-CM | POA: Diagnosis not present

## 2020-02-24 DIAGNOSIS — Z361 Encounter for antenatal screening for raised alphafetoprotein level: Secondary | ICD-10-CM | POA: Diagnosis not present

## 2020-03-17 DIAGNOSIS — Z3482 Encounter for supervision of other normal pregnancy, second trimester: Secondary | ICD-10-CM | POA: Diagnosis not present

## 2020-03-17 DIAGNOSIS — Z23 Encounter for immunization: Secondary | ICD-10-CM | POA: Diagnosis not present

## 2020-03-17 DIAGNOSIS — Z363 Encounter for antenatal screening for malformations: Secondary | ICD-10-CM | POA: Diagnosis not present

## 2020-04-01 DIAGNOSIS — Z3482 Encounter for supervision of other normal pregnancy, second trimester: Secondary | ICD-10-CM | POA: Diagnosis not present

## 2020-06-02 DIAGNOSIS — Z419 Encounter for procedure for purposes other than remedying health state, unspecified: Secondary | ICD-10-CM | POA: Diagnosis not present

## 2020-06-30 DIAGNOSIS — Z419 Encounter for procedure for purposes other than remedying health state, unspecified: Secondary | ICD-10-CM | POA: Diagnosis not present

## 2020-07-21 ENCOUNTER — Other Ambulatory Visit: Payer: Self-pay

## 2020-07-21 ENCOUNTER — Encounter (HOSPITAL_COMMUNITY): Payer: Self-pay | Admitting: *Deleted

## 2020-07-21 ENCOUNTER — Inpatient Hospital Stay (HOSPITAL_COMMUNITY)
Admission: AD | Admit: 2020-07-21 | Discharge: 2020-07-23 | DRG: 787 | Disposition: A | Discharge status: Home / Self Care | Payer: BC Managed Care – PPO | Attending: Obstetrics & Gynecology | Admitting: Obstetrics & Gynecology

## 2020-07-21 ENCOUNTER — Inpatient Hospital Stay (HOSPITAL_COMMUNITY): Payer: BC Managed Care – PPO | Admitting: Anesthesiology

## 2020-07-21 ENCOUNTER — Encounter (HOSPITAL_COMMUNITY)
Admission: AD | Disposition: A | Discharge status: Home / Self Care | Payer: Self-pay | Source: Home / Self Care | Attending: Obstetrics & Gynecology

## 2020-07-21 DIAGNOSIS — O26893 Other specified pregnancy related conditions, third trimester: Secondary | ICD-10-CM | POA: Diagnosis not present

## 2020-07-21 DIAGNOSIS — Z20822 Contact with and (suspected) exposure to covid-19: Secondary | ICD-10-CM | POA: Diagnosis not present

## 2020-07-21 DIAGNOSIS — D62 Acute posthemorrhagic anemia: Secondary | ICD-10-CM | POA: Diagnosis not present

## 2020-07-21 DIAGNOSIS — Z3A37 37 weeks gestation of pregnancy: Secondary | ICD-10-CM | POA: Diagnosis not present

## 2020-07-21 DIAGNOSIS — O99284 Endocrine, nutritional and metabolic diseases complicating childbirth: Secondary | ICD-10-CM | POA: Diagnosis not present

## 2020-07-21 DIAGNOSIS — O34219 Maternal care for unspecified type scar from previous cesarean delivery: Secondary | ICD-10-CM | POA: Diagnosis not present

## 2020-07-21 DIAGNOSIS — O9081 Anemia of the puerperium: Secondary | ICD-10-CM | POA: Diagnosis not present

## 2020-07-21 DIAGNOSIS — E559 Vitamin D deficiency, unspecified: Secondary | ICD-10-CM | POA: Diagnosis not present

## 2020-07-21 DIAGNOSIS — O34211 Maternal care for low transverse scar from previous cesarean delivery: Secondary | ICD-10-CM | POA: Diagnosis not present

## 2020-07-21 DIAGNOSIS — Z3A00 Weeks of gestation of pregnancy not specified: Secondary | ICD-10-CM | POA: Diagnosis not present

## 2020-07-21 HISTORY — DX: Anemia, unspecified: D64.9

## 2020-07-21 LAB — TYPE AND SCREEN
ABO/RH(D): O POS
Antibody Screen: NEGATIVE

## 2020-07-21 LAB — CBC
HCT: 31.5 % — ABNORMAL LOW (ref 36.0–46.0)
Hemoglobin: 10.8 g/dL — ABNORMAL LOW (ref 12.0–15.0)
MCH: 29.3 pg (ref 26.0–34.0)
MCHC: 34.3 g/dL (ref 30.0–36.0)
MCV: 85.6 fL (ref 80.0–100.0)
Platelets: 194 10*3/uL (ref 150–400)
RBC: 3.68 MIL/uL — ABNORMAL LOW (ref 3.87–5.11)
RDW: 14.5 % (ref 11.5–15.5)
WBC: 9.1 10*3/uL (ref 4.0–10.5)
nRBC: 0 % (ref 0.0–0.2)

## 2020-07-21 LAB — RESP PANEL BY RT-PCR (FLU A&B, COVID) ARPGX2
Influenza A by PCR: NEGATIVE
Influenza B by PCR: NEGATIVE
SARS Coronavirus 2 by RT PCR: NEGATIVE

## 2020-07-21 LAB — RAPID HIV SCREEN (HIV 1/2 AB+AG)
HIV 1/2 Antibodies: NONREACTIVE
HIV-1 P24 Antigen - HIV24: NONREACTIVE

## 2020-07-21 LAB — RPR: RPR Ser Ql: NONREACTIVE

## 2020-07-21 LAB — POCT FERN TEST: POCT Fern Test: POSITIVE

## 2020-07-21 SURGERY — Surgical Case
Anesthesia: Epidural

## 2020-07-21 MED ORDER — DEXAMETHASONE SODIUM PHOSPHATE 10 MG/ML IJ SOLN
INTRAMUSCULAR | Status: DC | PRN
  Administered 2020-07-21: 10 mg via INTRAVENOUS

## 2020-07-21 MED ORDER — FENTANYL CITRATE (PF) 100 MCG/2ML IJ SOLN
INTRAMUSCULAR | Status: DC | PRN
  Administered 2020-07-21: 100 ug via INTRAVENOUS

## 2020-07-21 MED ORDER — SODIUM CHLORIDE 0.9 % IR SOLN
Status: DC | PRN
  Administered 2020-07-21: 1

## 2020-07-21 MED ORDER — ACETAMINOPHEN 325 MG PO TABS
325.0000 mg | ORAL_TABLET | Freq: Once | ORAL | Status: DC | PRN
Start: 2020-07-21 — End: 2020-07-22

## 2020-07-21 MED ORDER — ONDANSETRON HCL 4 MG/2ML IJ SOLN
INTRAMUSCULAR | Status: AC
  Filled 2020-07-21: qty 2

## 2020-07-21 MED ORDER — LIDOCAINE-EPINEPHRINE (PF) 2 %-1:200000 IJ SOLN
INTRAMUSCULAR | Status: DC | PRN
  Administered 2020-07-21: 5 mL via EPIDURAL

## 2020-07-21 MED ORDER — MEPERIDINE HCL 25 MG/ML IJ SOLN: 6.2500 mg | INTRAMUSCULAR | Status: DC | PRN

## 2020-07-21 MED ORDER — EPHEDRINE 5 MG/ML INJ: 10.0000 mg | INTRAVENOUS | Status: DC | PRN

## 2020-07-21 MED ORDER — TERBUTALINE SULFATE 1 MG/ML IJ SOLN
0.2500 mg | Freq: Once | INTRAMUSCULAR | Status: AC | PRN
  Administered 2020-07-21: 0.25 mg via SUBCUTANEOUS
  Filled 2020-07-21: qty 1

## 2020-07-21 MED ORDER — OXYCODONE-ACETAMINOPHEN 5-325 MG PO TABS: 2.0000 | ORAL_TABLET | ORAL | Status: DC | PRN

## 2020-07-21 MED ORDER — CEFAZOLIN SODIUM-DEXTROSE 2-4 GM/100ML-% IV SOLN
2.0000 g | INTRAVENOUS | Status: AC
  Administered 2020-07-21: 2 g via INTRAVENOUS

## 2020-07-21 MED ORDER — SOD CITRATE-CITRIC ACID 500-334 MG/5ML PO SOLN
30.0000 mL | ORAL | Status: DC | PRN
  Administered 2020-07-21: 30 mL via ORAL
  Filled 2020-07-21: qty 15

## 2020-07-21 MED ORDER — SCOPOLAMINE 1 MG/3DAYS TD PT72
1.0000 | MEDICATED_PATCH | Freq: Once | TRANSDERMAL | Status: DC
  Administered 2020-07-21: 1.5 mg via TRANSDERMAL

## 2020-07-21 MED ORDER — FENTANYL-BUPIVACAINE-NACL 0.5-0.125-0.9 MG/250ML-% EP SOLN
12.0000 mL/h | EPIDURAL | Status: DC | PRN
  Administered 2020-07-21: 12 mL/h via EPIDURAL
  Filled 2020-07-21: qty 250

## 2020-07-21 MED ORDER — FENTANYL CITRATE (PF) 100 MCG/2ML IJ SOLN
INTRAMUSCULAR | Status: AC
  Filled 2020-07-21: qty 2

## 2020-07-21 MED ORDER — DIPHENHYDRAMINE HCL 50 MG/ML IJ SOLN: 12.5000 mg | INTRAMUSCULAR | Status: DC | PRN

## 2020-07-21 MED ORDER — PHENYLEPHRINE 40 MCG/ML (10ML) SYRINGE FOR IV PUSH (FOR BLOOD PRESSURE SUPPORT): 80.0000 ug | PREFILLED_SYRINGE | INTRAVENOUS | Status: DC | PRN

## 2020-07-21 MED ORDER — ACETAMINOPHEN 325 MG PO TABS: 650.0000 mg | ORAL_TABLET | ORAL | Status: DC | PRN

## 2020-07-21 MED ORDER — FENTANYL-BUPIVACAINE-NACL 0.5-0.125-0.9 MG/250ML-% EP SOLN: 12.0000 mL/h | EPIDURAL | Status: DC | PRN

## 2020-07-21 MED ORDER — PHENYLEPHRINE 40 MCG/ML (10ML) SYRINGE FOR IV PUSH (FOR BLOOD PRESSURE SUPPORT)
PREFILLED_SYRINGE | INTRAVENOUS | Status: AC
  Filled 2020-07-21: qty 10

## 2020-07-21 MED ORDER — LIDOCAINE HCL (PF) 1 % IJ SOLN: 30.0000 mL | INTRAMUSCULAR | Status: DC | PRN

## 2020-07-21 MED ORDER — OXYCODONE-ACETAMINOPHEN 5-325 MG PO TABS: 1.0000 | ORAL_TABLET | ORAL | Status: DC | PRN

## 2020-07-21 MED ORDER — MORPHINE SULFATE (PF) 0.5 MG/ML IJ SOLN
INTRAMUSCULAR | Status: AC
  Filled 2020-07-21: qty 10

## 2020-07-21 MED ORDER — ONDANSETRON HCL 4 MG/2ML IJ SOLN: 4.0000 mg | Freq: Four times a day (QID) | INTRAMUSCULAR | Status: DC | PRN

## 2020-07-21 MED ORDER — ONDANSETRON HCL 4 MG/2ML IJ SOLN
INTRAMUSCULAR | Status: DC | PRN
  Administered 2020-07-21: 4 mg via INTRAVENOUS

## 2020-07-21 MED ORDER — DEXAMETHASONE SODIUM PHOSPHATE 10 MG/ML IJ SOLN
INTRAMUSCULAR | Status: AC
  Filled 2020-07-21: qty 1

## 2020-07-21 MED ORDER — KETOROLAC TROMETHAMINE 30 MG/ML IJ SOLN
30.0000 mg | Freq: Four times a day (QID) | INTRAMUSCULAR | Status: AC | PRN
  Administered 2020-07-21: 30 mg via INTRAMUSCULAR

## 2020-07-21 MED ORDER — DIPHENHYDRAMINE HCL 25 MG PO CAPS: 25.0000 mg | ORAL_CAPSULE | ORAL | Status: DC | PRN

## 2020-07-21 MED ORDER — BUPIVACAINE HCL (PF) 0.25 % IJ SOLN
INTRAMUSCULAR | Status: DC | PRN
  Administered 2020-07-21 (×2): 5 mL via EPIDURAL

## 2020-07-21 MED ORDER — PHENYLEPHRINE HCL (PRESSORS) 10 MG/ML IV SOLN
INTRAVENOUS | Status: DC | PRN
  Administered 2020-07-21 (×2): 120 ug via INTRAVENOUS

## 2020-07-21 MED ORDER — ACETAMINOPHEN 160 MG/5ML PO SOLN
325.0000 mg | Freq: Once | ORAL | Status: DC | PRN
Start: 2020-07-21 — End: 2020-07-22

## 2020-07-21 MED ORDER — LIDOCAINE-EPINEPHRINE 2 %-1:100000 IJ SOLN
INTRAMUSCULAR | Status: DC | PRN
  Administered 2020-07-21: 5 mL via INTRADERMAL

## 2020-07-21 MED ORDER — LACTATED RINGERS IV SOLN: INTRAVENOUS | Status: DC

## 2020-07-21 MED ORDER — OXYTOCIN-SODIUM CHLORIDE 30-0.9 UT/500ML-% IV SOLN
INTRAVENOUS | Status: AC
  Filled 2020-07-21: qty 500

## 2020-07-21 MED ORDER — SCOPOLAMINE 1 MG/3DAYS TD PT72
MEDICATED_PATCH | TRANSDERMAL | Status: AC
  Filled 2020-07-21: qty 1

## 2020-07-21 MED ORDER — KETOROLAC TROMETHAMINE 30 MG/ML IJ SOLN
INTRAMUSCULAR | Status: AC
  Filled 2020-07-21: qty 1

## 2020-07-21 MED ORDER — FENTANYL CITRATE (PF) 100 MCG/2ML IJ SOLN
INTRAMUSCULAR | Status: DC | PRN
  Administered 2020-07-21 (×2): 50 ug via EPIDURAL

## 2020-07-21 MED ORDER — OXYTOCIN-SODIUM CHLORIDE 30-0.9 UT/500ML-% IV SOLN: 2.5000 [IU]/h | INTRAVENOUS | Status: DC

## 2020-07-21 MED ORDER — OXYTOCIN-SODIUM CHLORIDE 30-0.9 UT/500ML-% IV SOLN
INTRAVENOUS | Status: DC | PRN
  Administered 2020-07-21: 300 mL via INTRAVENOUS

## 2020-07-21 MED ORDER — LACTATED RINGERS IV SOLN: 500.0000 mL | Freq: Once | INTRAVENOUS | Status: DC

## 2020-07-21 MED ORDER — SOD CITRATE-CITRIC ACID 500-334 MG/5ML PO SOLN: 30.0000 mL | ORAL | Status: DC

## 2020-07-21 MED ORDER — ACETAMINOPHEN 10 MG/ML IV SOLN
1000.0000 mg | Freq: Once | INTRAVENOUS | Status: DC | PRN
  Administered 2020-07-21: 1000 mg via INTRAVENOUS

## 2020-07-21 MED ORDER — LACTATED RINGERS IV SOLN
500.0000 mL | INTRAVENOUS | Status: DC | PRN
Start: 2020-07-21 — End: 2020-07-22

## 2020-07-21 MED ORDER — STERILE WATER FOR IRRIGATION IR SOLN
Status: DC | PRN
  Administered 2020-07-21: 1000 mL

## 2020-07-21 MED ORDER — FENTANYL CITRATE (PF) 100 MCG/2ML IJ SOLN
25.0000 ug | INTRAMUSCULAR | Status: DC | PRN
Start: 2020-07-21 — End: 2020-07-22

## 2020-07-21 MED ORDER — ACETAMINOPHEN 10 MG/ML IV SOLN
INTRAVENOUS | Status: AC
  Filled 2020-07-21: qty 100

## 2020-07-21 MED ORDER — OXYTOCIN BOLUS FROM INFUSION: 333.0000 mL | Freq: Once | INTRAVENOUS | Status: DC

## 2020-07-21 MED ORDER — OXYTOCIN-SODIUM CHLORIDE 30-0.9 UT/500ML-% IV SOLN
1.0000 m[IU]/min | INTRAVENOUS | Status: DC
  Administered 2020-07-21: 2 m[IU]/min via INTRAVENOUS
  Filled 2020-07-21: qty 500

## 2020-07-21 MED ORDER — MORPHINE SULFATE (PF) 0.5 MG/ML IJ SOLN
INTRAMUSCULAR | Status: DC | PRN
  Administered 2020-07-21: 3 mg via EPIDURAL

## 2020-07-21 MED ORDER — CEFAZOLIN SODIUM-DEXTROSE 2-4 GM/100ML-% IV SOLN
INTRAVENOUS | Status: AC
  Filled 2020-07-21: qty 100

## 2020-07-21 MED ORDER — KETOROLAC TROMETHAMINE 30 MG/ML IJ SOLN: 30.0000 mg | Freq: Four times a day (QID) | INTRAMUSCULAR | Status: AC | PRN

## 2020-07-21 SURGICAL SUPPLY — 38 items
BENZOIN TINCTURE PRP APPL 2/3 (GAUZE/BANDAGES/DRESSINGS) ×2 IMPLANT
CHLORAPREP W/TINT 26ML (MISCELLANEOUS) ×2 IMPLANT
CLAMP CORD UMBIL (MISCELLANEOUS) IMPLANT
CLOTH BEACON ORANGE TIMEOUT ST (SAFETY) ×2 IMPLANT
DERMABOND ADVANCED (GAUZE/BANDAGES/DRESSINGS)
DERMABOND ADVANCED .7 DNX12 (GAUZE/BANDAGES/DRESSINGS) IMPLANT
DRAPE C SECTION CLR SCREEN (DRAPES) ×2 IMPLANT
DRSG OPSITE POSTOP 4X10 (GAUZE/BANDAGES/DRESSINGS) ×2 IMPLANT
ELECT REM PT RETURN 9FT ADLT (ELECTROSURGICAL) ×2
ELECTRODE REM PT RTRN 9FT ADLT (ELECTROSURGICAL) ×1 IMPLANT
EXTRACTOR VACUUM M CUP 4 TUBE (SUCTIONS) IMPLANT
GLOVE BIOGEL PI IND STRL 7.0 (GLOVE) ×2 IMPLANT
GLOVE BIOGEL PI INDICATOR 7.0 (GLOVE) ×2
GLOVE SURG SS PI 6.5 STRL IVOR (GLOVE) ×2 IMPLANT
GOWN STRL REUS W/TWL LRG LVL3 (GOWN DISPOSABLE) ×4 IMPLANT
KIT ABG SYR 3ML LUER SLIP (SYRINGE) IMPLANT
NEEDLE HYPO 25X5/8 SAFETYGLIDE (NEEDLE) IMPLANT
NS IRRIG 1000ML POUR BTL (IV SOLUTION) ×2 IMPLANT
PACK C SECTION WH (CUSTOM PROCEDURE TRAY) ×2 IMPLANT
PAD OB MATERNITY 4.3X12.25 (PERSONAL CARE ITEMS) ×2 IMPLANT
PENCIL SMOKE EVAC W/HOLSTER (ELECTROSURGICAL) ×2 IMPLANT
RTRCTR C-SECT PINK 25CM LRG (MISCELLANEOUS) ×2 IMPLANT
STRIP CLOSURE SKIN 1/2X4 (GAUZE/BANDAGES/DRESSINGS) ×4 IMPLANT
SUT CHROMIC 1 CTX 36 (SUTURE) IMPLANT
SUT CHROMIC 2 0 CT 1 (SUTURE) ×2 IMPLANT
SUT MON AB 4-0 PS1 27 (SUTURE) ×2 IMPLANT
SUT PLAIN 1 NONE 54 (SUTURE) IMPLANT
SUT PLAIN 2 0 (SUTURE)
SUT PLAIN 2 0 XLH (SUTURE) ×4 IMPLANT
SUT PLAIN ABS 2-0 CT1 27XMFL (SUTURE) IMPLANT
SUT VIC AB 0 CTX 36 (SUTURE) ×1
SUT VIC AB 0 CTX36XBRD ANBCTRL (SUTURE) ×1 IMPLANT
SUT VIC AB 1 CTX 36 (SUTURE) ×2
SUT VIC AB 1 CTX36XBRD ANBCTRL (SUTURE) ×2 IMPLANT
SUT VIC AB 4-0 KS 27 (SUTURE) ×2 IMPLANT
TOWEL OR 17X24 6PK STRL BLUE (TOWEL DISPOSABLE) ×2 IMPLANT
TRAY FOLEY W/BAG SLVR 14FR LF (SET/KITS/TRAYS/PACK) ×2 IMPLANT
WATER STERILE IRR 1000ML POUR (IV SOLUTION) ×2 IMPLANT

## 2020-07-21 NOTE — Interval H&P Note (Signed)
History and Physical Interval Note:  07/21/2020 8:37 PM  Bridget Bray  has presented today for surgery, with the diagnosis of 1 prior cesarean section and desires a repeat cesarean section.  The various methods of treatment have been discussed with the patient and family. After consideration of risks, benefits and other options for treatment, the patient has consented to  Procedure(s): CESAREAN SECTION (N/A) REPEAT as a surgical intervention.  The patient's history has been reviewed, patient examined, no change in status, stable for surgery.  I have reviewed the patient's chart and labs.  Questions were answered to the patient's satisfaction.    Prescilla Sours, MD.   07/21/2020. 6720.

## 2020-07-21 NOTE — Transfer of Care (Signed)
Immediate Anesthesia Transfer of Care Note  Patient: Bridget Bray  Procedure(s) Performed: CESAREAN SECTION (N/A )  Patient Location: PACU  Anesthesia Type:Epidural  Level of Consciousness: awake, alert  and oriented  Airway & Oxygen Therapy: Patient Spontanous Breathing  Post-op Assessment: Report given to RN and Post -op Vital signs reviewed and stable  Post vital signs: Reviewed and stable  Last Vitals:  Vitals Value Taken Time  BP 104/65 07/21/20 2219  Temp    Pulse 82 07/21/20 2223  Resp 15 07/21/20 2223  SpO2 100 % 07/21/20 2223  Vitals shown include unvalidated device data.  Last Pain:  Vitals:   07/21/20 1706  TempSrc: Oral  PainSc: 5          Complications: No complications documented.

## 2020-07-21 NOTE — MAU Note (Signed)
Started leaking around 0630. Pinkish clear fluid, pants are wet- fluid still coming. No active bleeding. No complaints of pain.  Some pressure in low back, not painful. Prior c/s ((36labor, baby indicationn went to NICU) plans for TOLAC.Bridget Bray

## 2020-07-21 NOTE — Anesthesia Preprocedure Evaluation (Addendum)
Anesthesia Evaluation  Patient identified by MRN, date of birth, ID band Patient awake    Reviewed: Allergy & Precautions, NPO status , Patient's Chart, lab work & pertinent test results  Airway Mallampati: III  TM Distance: >3 FB Neck ROM: Full    Dental no notable dental hx.    Pulmonary    Pulmonary exam normal        Cardiovascular Normal cardiovascular exam     Neuro/Psych    GI/Hepatic   Endo/Other    Renal/GU      Musculoskeletal   Abdominal Normal abdominal exam  (+)   Peds  Hematology   Anesthesia Other Findings   Reproductive/Obstetrics (+) Pregnancy                           Anesthesia Physical Anesthesia Plan  ASA: II  Anesthesia Plan: Epidural   Post-op Pain Management:    Induction:   PONV Risk Score and Plan: 1 and Ondansetron  Airway Management Planned: Natural Airway and Simple Face Mask  Additional Equipment: None  Intra-op Plan:   Post-operative Plan:   Informed Consent: I have reviewed the patients History and Physical, chart, labs and discussed the procedure including the risks, benefits and alternatives for the proposed anesthesia with the patient or authorized representative who has indicated his/her understanding and acceptance.     Dental advisory given  Plan Discussed with: CRNA  Anesthesia Plan Comments: (Lab Results      Component                Value               Date                      WBC                      9.1                 07/21/2020                HGB                      10.8 (L)            07/21/2020                HCT                      31.5 (L)            07/21/2020                MCV                      85.6                07/21/2020                PLT                      194                 07/21/2020           )       Anesthesia Quick Evaluation

## 2020-07-21 NOTE — Progress Notes (Signed)
Bridget Bray is a 25 y.o. G3P1011 at [redacted]w[redacted]d admitted for SROM on 07/21/20 @ 0630. Requesting TOLAC.  Subjective:  Bridget Bray is coping well with ctxs. Ambulating. POC discussed regarding Pitocin. Pt requesting pit augmentation. Bedside U/S done. Vertex. SVE 1.5/70/-3.   Objective: BP 103/66 (BP Location: Right Arm)   Pulse 78   Temp 97.9 F (36.6 C) (Oral)   Resp 16   Ht 4\' 11"  (1.499 m)   Wt 76 kg   SpO2 99%   BMI 33.85 kg/m  No intake/output data recorded. No intake/output data recorded.  FHT:  FHR: 150 bpm, variability: moderate,  accelerations:  Present,  decelerations:  Absent UC:   regular, every 3 minutes SVE:   Dilation: 1.5 Effacement (%): 70 Station: -3 Exam by:: [email or web address] CNM  Labs: Lab Results  Component Value Date   WBC 9.1 07/21/2020   HGB 10.8 (L) 07/21/2020   HCT 31.5 (L) 07/21/2020   MCV 85.6 07/21/2020   PLT 194 07/21/2020    Assessment / Plan: Will start pitocin 2x2. CAT 1.   Labor: Progressing normally Fetal Wellbeing:  Category I Pain Control:  Epidural  Prn I/D:  GBS negative Anticipated MOD:  NSVD   07/23/2020 Tsutomu Barfoot MSN, CNM 07/21/2020, 1:51 PM

## 2020-07-21 NOTE — Op Note (Signed)
Patient: Bridget Bray DOB: December 31, 1995 MRN:  [id number]  DATE OF SURGERY: 07/21/2020  PREOP DIAGNOSIS:  1. 37 week 3 day EGA IUP. 2.  History of 1 prior cesarean section and desiring a repeat cesarean delivery, she declines continued trial of labor and vaginal birth after a cesarean section.  POSTOP DIAGNOSIS: Same as above.   PROCEDURE:  1. Repeat low uterine segment transverse cesarean section via Pfannenstiel incision.  2. Revision of Cesarean section scar.   SURGEON: Dr.  Angus Palms Sallye Ober  ASSISTANT: Johney Frame, CNM.  ATTENDING ATTESTATION: I was present and scrubbed and performed the procedure and the assistant was required due to the complexity of anatomy.  ANESTHESIA: Bolused epidural.  COMPLICATIONS: None  FINDINGS: Viable female infant in cephalic presentation, DOP, weight pending, Apgar scores of 10 and 10. Normal uterus and bilateral fallopian tubes.  Normal left and right ovaries.     EBL:   408 cc  IV FLUID: 2500 cc LR   URINE OUTPUT:  100 cc clear urine  INDICATIONS:  25 y/o P1 who presented with spontaneous rupture of membranes and 1 cm dilated.  She progressed to 4 cm with pitocin augmentation for trial of labor however when she got to to this point she changed her mind and desired a repeat cesarean section as she felt she was making slow progress.  She was consented for the procedure after explaining risks benefits and alternatives of the procedure including but not limited to risks of heavy bleeding, infection, damage to organs and complications in future pregnancies involving the placenta.   PROCEDURE:  Informed consent was obtained from the patient to undergo the procedure. She was taken to the operating room where her spinal anesthesia was found to be adequate. She was prepped and draped in the usual sterile fashion and a Foley catheter was placed. She received IV ancef preoperatively. A Pfannenstiel incision was made with the scalpel over the prior  incision in an elliptical incision and the old keloided scar removed.  The incision was then extended through the subcutaneous layer and also the fascia with the bovie. Small perforators in the subcutaneous layer were contained with the Bovie. The fascia was nicked in the midline and then was further separated from the rectus muscles bilaterally using Mayo scissors. Kochers were placed inferiorly and then superiorly to allow further separation of fascia from the rectus muscles.  The peritoneal cavity was entered bluntly with the fingers. The Alexis retractor was placed in.   Small adhesions between the bladder and the right uterus were lysed with the metzenbaum scissors.  The bladder flap was created using Metzenbaum scissors.     The uterus was incised with a scalpel and the incision extended bluntly bilaterally with bandage scissors.  Membranes were ruptured and moderate clear amniotic fluid was noted.  The head then the rest of the body was then delivered with abdominal pressure.  She delivered a viable female infant "Bridget Bray".  The edges of the uterus was grasped with ring forceps.  The cord was clamped and cut after 1 minute. Cord blood was collected.   The uterus was not exteriorized.  The placenta was delivered with gentle traction on the umbilical cord.  The uterus was cleared of clots and debris with a lap.  The uterine incision was closed with #1 Vicryl in a running locked stitch. An imbricating layer of the same stitch was placed over the initial closure.  A small area that bled on the left side was contained with  figure of 8 stitch.  Irrigation was applied and suctioned out. Excellent hemostasis was noted over the incision.  The muscles and peritoneum were then reapproximated using chromic suture.  Fascia was closed using 0 Vicryl in a running stitch. The subcutaneous layer was irrigated and suctioned out. Small perforators were contained with the bovie.  The subcutaneous layer was closed using 1-0  plain in interrupted stitches. The skin was closed using 4-0 Vicryl on the [employer].  Benzocaine and steri strips were applied.  Honeycomb was then applied. The patient was then cleaned and she was taken to the recovery room with her baby in stable conditions.   SPECIMEN: Placenta to labor and delivery, umbilical cord blood to lab.   DISPOSITION: TO PACU, STABLE.   Dr. Sallye Ober.   07/21/2020.

## 2020-07-21 NOTE — Anesthesia Postprocedure Evaluation (Signed)
Anesthesia Post Note  Patient: Bridget Bray  Procedure(s) Performed: CESAREAN SECTION (N/A )     Patient location during evaluation: PACU Anesthesia Type: Epidural Level of consciousness: oriented and awake and alert Pain management: pain level controlled Vital Signs Assessment: post-procedure vital signs reviewed and stable Respiratory status: spontaneous breathing, respiratory function stable and patient connected to nasal cannula oxygen Cardiovascular status: blood pressure returned to baseline and stable Postop Assessment: no headache, no backache, no apparent nausea or vomiting and epidural receding Anesthetic complications: no   No complications documented.  Last Vitals:  Vitals:   07/21/20 2220 07/21/20 2231  BP: 104/65 (!) 93/55  Pulse: 95 80  Resp:  11  Temp: (!) 36.3 C   SpO2: 100% 100%    Last Pain:  Vitals:   07/21/20 2220  TempSrc: Oral  PainSc:    Pain Goal:                   Shelton Silvas

## 2020-07-21 NOTE — H&P (Signed)
OB ADMISSION/ HISTORY & PHYSICAL:  Admission Date: 07/21/2020  6:54 AM  Admit Diagnosis: SROM, TOLAC  Bridget Bray is a 25 y.o. female G3P1011 [redacted]w[redacted]d presenting for SROM, clear fluid @ 0630. Endorses active FM. Does not feel ctxs. Requesting a TOLAC, opened to R C/S if needed.   History of current pregnancy: G3P1011   Primary OB Provider: [facility] Patient entered care with [facility] and transferred care to [facility] on 06/07/20 wks.   EDC 08/08/20 by sure LMP07/03/21 and congruent w/ 12/06/19 U/S.   Anatomy scan:  completed at [facility]  Last evaluation: 36 wks  Significant prenatal events: N/A Patient Active Problem List   Diagnosis Date Noted  . SAB (spontaneous abortion) 05/20/2019  . Frequent stools 11/28/2017  . Vitamin D deficiency 08/01/2017    Prenatal Labs: ABO, Rh: --/--/PENDING (03/22 0930) O Pos Antibody: PENDING (03/22 0930) Negative Rubella:   Immune RPR:   NR HBsAg:   Negative HIV:   NR GTT: WNL GBS:   Negative GC/CHL: Negative/Negative Genetics: AFP, Quad screen WNL Tdap/influenza vaccines: UTD/UTD   OB History  Gravida Para Term Preterm AB Living  3 1 1   1 1   SAB IAB Ectopic Multiple Live Births  1     0 1    # Outcome Date GA Lbr Len/2nd Weight Sex Delivery Anes PTL Lv  3 Current           2 Term 02/14/18 [redacted]w[redacted]d  3450 g F CS-LTranv EPI  LIV  1 SAB             Medical / Surgical History: Past medical history:  Past Medical History:  Diagnosis Date  . Anemia   . Medical history non-contributory     Past surgical history:  Past Surgical History:  Procedure Laterality Date  . CESAREAN SECTION N/A 02/14/2018   Procedure: CESAREAN SECTION;  Surgeon: 02/16/2018, MD;  Location: [facility];  Service: Obstetrics;  Laterality: N/A;  . TENDON REPAIR Right 09/05/2012   Procedure: NERVE AND TENDON REPAIR;  Surgeon: 11/05/2012, MD;  Location: [facility] OR;  Service: Orthopedics;  Laterality: Right;  . WOUND EXPLORATION Right 09/05/2012    Procedure: WOUND EXPLORATION OF RIGHT WRIST;  Surgeon: 11/05/2012, MD;  Location: [facility] OR;  Service: Orthopedics;  Laterality: Right;   Family History:  Family History  Problem Relation Age of Onset  . Healthy Mother   . Healthy Father   . ADD / ADHD Neg Hx   . Alcohol abuse Neg Hx   . Anxiety disorder Neg Hx   . Arthritis Neg Hx   . Asthma Neg Hx   . Birth defects Neg Hx   . Cancer Neg Hx   . COPD Neg Hx   . Depression Neg Hx   . Diabetes Neg Hx   . Drug abuse Neg Hx   . Early death Neg Hx   . Hearing loss Neg Hx   . Heart disease Neg Hx   . Hyperlipidemia Neg Hx   . Hypertension Neg Hx   . Kidney disease Neg Hx   . Intellectual disability Neg Hx   . Learning disabilities Neg Hx   . Miscarriages / Stillbirths Neg Hx   . Stroke Neg Hx   . Obesity Neg Hx   . Vision loss Neg Hx   . Varicose Veins Neg Hx     Social History:  reports that she has never smoked. She has never used smokeless tobacco. She reports  that she does not drink alcohol and does not use drugs.  Allergies: Patient has no known allergies.   Current Medications at time of admission:  Prior to Admission medications   Medication Sig Start Date End Date Taking? Authorizing Provider  Prenatal Vit-Fe Fumarate-FA (PRENATAL MULTIVITAMIN) TABS tablet Take 1 tablet by mouth daily at 12 noon.   Yes [provider]  ibuprofen (ADVIL) 600 MG tablet Take 1 tablet (600 mg total) by mouth every 6 (six) hours as needed. Patient not taking: No sig reported 05/08/19   Aviva Signs, CNM  Vitamin D, Ergocalciferol, (DRISDOL) 50000 units CAPS capsule Take 1 capsule (50,000 Units total) by mouth every 7 (seven) days. Patient not taking: No sig reported 08/23/17   Roe Coombs, CNM    Review of Systems: Constitutional: Negative   HENT: Negative   Eyes: Negative   Respiratory: Negative   Cardiovascular: Negative   Gastrointestinal: Negative  Genitourinary: Negativefor bloody show, Positve for LOF    Musculoskeletal: Negative   Skin: Negative   Neurological: Negative   Endo/Heme/Allergies: Negative   Psychiatric/Behavioral: Negative    Physical Exam: VS: Blood pressure 106/74, pulse (!) 106, temperature 97.8 F (36.6 C), temperature source Oral, resp. rate 18, height 4\' 11"  (1.499 m), weight 76 kg, SpO2 99 %, unknown if currently breastfeeding. AAO x3, no signs of distress Cardiovascular: RRR Respiratory: Lung fields clear to ausculation GU/GI: Abdomen gravid, non-tender, non-distended, active FM, vertex, EFW 7-8 per Leopold's Extremities: negative edema, negative for pain, tenderness, and cords  Cervical exam:Dilation: 1 Effacement (%): 80 Station: Ballotable Exam by:: Jolynn FHR: baseline rate 150 / variability moderate / accelerations present / absent decelerations TOCO: 3-5   Prenatal Transfer Tool  Maternal Diabetes: No Genetic Screening: Normal Maternal Ultrasounds/Referrals: Normal Fetal Ultrasounds or other Referrals:  None Maternal Substance Abuse:  No Significant Maternal Medications:  None Significant Maternal Lab Results: Group B Strep negative    Assessment: 25 y.o. G3P1011 [redacted]w[redacted]d  Admitted for SROM of clear fluid @ 0630 on 07/21/20. Requesting TOLAC.   latent stage of labor FHR category 1 GBS negative Pain management plan: Epidural PRN   Plan:  Admit to L&D Routine admission orders Epidural PRN Dr 07/23/20 notified of admission and plan of care  Sallye Ober MSN, CNM 07/21/2020 10:54 AM

## 2020-07-21 NOTE — H&P (View-Only) (Signed)
Subjective:    Called to room Pt involuntarily pushing. SVE 4/100/0. Requested anesthesia at bedside to help get her comfortable. Pt requesting Repeat c/section. Dr Kulwa and OR notified.    Objective:    VS: BP (!) 98/56   Pulse 76   Temp (!) 97.5 F (36.4 C) (Oral)   Resp 18   Ht 4' 11" (1.499 m)   Wt 76 kg   SpO2 99%   BMI 33.85 kg/m  FHR : baseline 120 / variability moderate / accelerations present / absent decelerations Toco: contractions every 2 minutes  Membranes: SROM 0630 Dilation: 4 Effacement (%): 100 Cervical Position: Posterior,Middle Station: 0 Presentation: Vertex Exam by:: Kim Kashana Breach, CNM Pitocin 4 mU/min  Assessment/Plan:   24 y.o. G3P1011 [redacted]w[redacted]d in active labor now. Requesting repeat cesarean section.   Labor: Progressing normally. OR and Dr Kulwa called for Repeat cesarean section per pt request Preeclampsia:  no signs or symptoms of toxicity Fetal Wellbeing:  Category I Pain Control:  Epidural I/D:  GBS negative Anticipated MOD:  Repeat Cesearan Section  Dr Kulwa in route  Bridget Pepperman B Deane Wattenbarger MSN, CNM 07/21/2020 8:28 PM 

## 2020-07-21 NOTE — Progress Notes (Signed)
Subjective:    Called to room Pt involuntarily pushing. SVE 4/100/0. Requested anesthesia at bedside to help get her comfortable. Pt requesting Repeat c/section. Dr Sallye Ober and OR notified.    Objective:    VS: BP (!) 98/56   Pulse 76   Temp (!) 97.5 F (36.4 C) (Oral)   Resp 18   Ht 4\' 11"  (1.499 m)   Wt 76 kg   SpO2 99%   BMI 33.85 kg/m  FHR : baseline 120 / variability moderate / accelerations present / absent decelerations Toco: contractions every 2 minutes  Membranes: SROM 0630 Dilation: 4 Effacement (%): 100 Cervical Position: Posterior,Middle Station: 0 Presentation: Vertex Exam by:: [email or web address], CNM Pitocin 4 mU/min  Assessment/Plan:   25 y.o. G3P1011 [redacted]w[redacted]d in active labor now. Requesting repeat cesarean section.   Labor: Progressing normally. OR and Dr [redacted]w[redacted]d called for Repeat cesarean section per pt request Preeclampsia:  no signs or symptoms of toxicity Fetal Wellbeing:  Category I Pain Control:  Epidural I/D:  GBS negative Anticipated MOD:  Repeat Cesearan Section  Dr Sallye Ober in route  Sallye Ober MSN, CNM 07/21/2020 8:28 PM

## 2020-07-21 NOTE — Anesthesia Procedure Notes (Signed)
Epidural Patient location during procedure: OB Start time: 07/21/2020 6:42 PM End time: 07/21/2020 6:48 PM  Staffing Anesthesiologist: Shelton Silvas, MD Performed: anesthesiologist   Preanesthetic Checklist Completed: patient identified, IV checked, site marked, risks and benefits discussed, surgical consent, monitors and equipment checked, pre-op evaluation and timeout performed  Epidural Patient position: sitting Prep: DuraPrep Patient monitoring: heart rate, continuous pulse ox and blood pressure Approach: midline Location: L3-L4 Injection technique: LOR saline  Needle:  Needle type: Tuohy  Needle gauge: 17 G Needle length: 9 cm Catheter type: closed end flexible Catheter size: 20 Guage Test dose: negative and 1.5% lidocaine  Assessment Events: blood not aspirated, injection not painful, no injection resistance and no paresthesia  Additional Notes LOR @ 5  Patient identified. Risks/Benefits/Options discussed with patient including but not limited to bleeding, infection, nerve damage, paralysis, failed block, incomplete pain control, headache, blood pressure changes, nausea, vomiting, reactions to medications, itching and postpartum back pain. Confirmed with bedside nurse the patient's most recent platelet count. Confirmed with patient that they are not currently taking any anticoagulation, have any bleeding history or any family history of bleeding disorders. Patient expressed understanding and wished to proceed. All questions were answered. Sterile technique was used throughout the entire procedure. Please see nursing notes for vital signs. Test dose was given through epidural catheter and negative prior to continuing to dose epidural or start infusion. Warning signs of high block given to the patient including shortness of breath, tingling/numbness in hands, complete motor block, or any concerning symptoms with instructions to call for help. Patient was given instructions on  fall risk and not to get out of bed. All questions and concerns addressed with instructions to call with any issues or inadequate analgesia.    Reason for block:procedure for pain

## 2020-07-21 NOTE — MAU Note (Signed)
?   Tracing at times due to frequent pt movement.  Pt repositioned to side, monitors adm

## 2020-07-21 NOTE — Progress Notes (Signed)
Subjective:    Rashena continues to cope well with ctxs. Pitocin decreased to 54mu. Ctx 2-36min. Husband at bedside. She is up in the shower. CAT 1 tracing.   Objective:    VS: BP 104/65   Pulse 90   Temp (!) 97.5 F (36.4 C) (Oral)   Resp 18   Ht 4\' 11"  (1.499 m)   Wt 76 kg   SpO2 99%   BMI 33.85 kg/m  FHR : baseline 135 / variability moderate / accelerations present / absent decelerations Toco: contractions every 2-3 minutes /  Membranes: SROM @ 0630 Dilation: 1.5 Effacement (%): 70 Cervical Position: Posterior,Middle Station: -3 Presentation: Vertex (by u/s) Exam by:: Kim Larin Depaoli CNM Pitocin 8 mU/min  Assessment/Plan:   25 y.o. G3P1011 [redacted]w[redacted]d latent phase labor. Augmentation with pitocin.   Labor: Progressing normally Preeclampsia:  no signs or symptoms of toxicity Fetal Wellbeing:  Category I Pain Control:  Epidural prn I/D:  GBS negative Anticipated MOD:  NSVD  [redacted]w[redacted]d MSN, CNM 07/21/2020 6:01 PM

## 2020-07-22 ENCOUNTER — Encounter (HOSPITAL_COMMUNITY): Payer: Self-pay | Admitting: Obstetrics & Gynecology

## 2020-07-22 LAB — CBC
HCT: 23.8 % — ABNORMAL LOW (ref 36.0–46.0)
Hemoglobin: 8 g/dL — ABNORMAL LOW (ref 12.0–15.0)
MCH: 29.1 pg (ref 26.0–34.0)
MCHC: 33.6 g/dL (ref 30.0–36.0)
MCV: 86.5 fL (ref 80.0–100.0)
Platelets: 188 10*3/uL (ref 150–400)
RBC: 2.75 MIL/uL — ABNORMAL LOW (ref 3.87–5.11)
RDW: 14.5 % (ref 11.5–15.5)
WBC: 14.4 10*3/uL — ABNORMAL HIGH (ref 4.0–10.5)
nRBC: 0 % (ref 0.0–0.2)

## 2020-07-22 MED ORDER — NALBUPHINE HCL 10 MG/ML IJ SOLN
5.0000 mg | INTRAMUSCULAR | Status: DC | PRN
Start: 2020-07-22 — End: 2020-07-23

## 2020-07-22 MED ORDER — SENNOSIDES-DOCUSATE SODIUM 8.6-50 MG PO TABS
2.0000 | ORAL_TABLET | Freq: Every day | ORAL | Status: DC
  Administered 2020-07-23: 2 via ORAL
  Filled 2020-07-22: qty 2

## 2020-07-22 MED ORDER — COCONUT OIL OIL: 1.0000 "application " | TOPICAL_OIL | Status: DC | PRN

## 2020-07-22 MED ORDER — NALBUPHINE HCL 10 MG/ML IJ SOLN: 5.0000 mg | INTRAMUSCULAR | Status: DC | PRN

## 2020-07-22 MED ORDER — PRENATAL MULTIVITAMIN CH
1.0000 | ORAL_TABLET | Freq: Every day | ORAL | Status: DC
  Administered 2020-07-23: 1 via ORAL
  Filled 2020-07-22: qty 1

## 2020-07-22 MED ORDER — SODIUM CHLORIDE 0.9% FLUSH: 3.0000 mL | INTRAVENOUS | Status: DC | PRN

## 2020-07-22 MED ORDER — OXYTOCIN-SODIUM CHLORIDE 30-0.9 UT/500ML-% IV SOLN: 2.5000 [IU]/h | INTRAVENOUS | Status: AC

## 2020-07-22 MED ORDER — IBUPROFEN 800 MG PO TABS
800.0000 mg | ORAL_TABLET | Freq: Three times a day (TID) | ORAL | Status: DC
  Administered 2020-07-23 (×2): 800 mg via ORAL
  Filled 2020-07-22 (×2): qty 1

## 2020-07-22 MED ORDER — SIMETHICONE 80 MG PO CHEW: 80.0000 mg | CHEWABLE_TABLET | ORAL | Status: DC | PRN

## 2020-07-22 MED ORDER — DIBUCAINE (PERIANAL) 1 % EX OINT: 1.0000 "application " | TOPICAL_OINTMENT | CUTANEOUS | Status: DC | PRN

## 2020-07-22 MED ORDER — NALOXONE HCL 0.4 MG/ML IJ SOLN: 0.4000 mg | INTRAMUSCULAR | Status: DC | PRN

## 2020-07-22 MED ORDER — KETOROLAC TROMETHAMINE 30 MG/ML IJ SOLN
30.0000 mg | Freq: Four times a day (QID) | INTRAMUSCULAR | Status: AC
  Administered 2020-07-22 (×2): 30 mg via INTRAVENOUS
  Filled 2020-07-22 (×2): qty 1

## 2020-07-22 MED ORDER — NALBUPHINE HCL 10 MG/ML IJ SOLN
5.0000 mg | Freq: Once | INTRAMUSCULAR | Status: DC | PRN
Start: 2020-07-22 — End: 2020-07-23

## 2020-07-22 MED ORDER — ACETAMINOPHEN 500 MG PO TABS
1000.0000 mg | ORAL_TABLET | Freq: Four times a day (QID) | ORAL | Status: DC
  Administered 2020-07-22 – 2020-07-23 (×4): 1000 mg via ORAL
  Filled 2020-07-22 (×4): qty 2

## 2020-07-22 MED ORDER — WITCH HAZEL-GLYCERIN EX PADS: 1.0000 "application " | MEDICATED_PAD | CUTANEOUS | Status: DC | PRN

## 2020-07-22 MED ORDER — ONDANSETRON HCL 4 MG/2ML IJ SOLN
4.0000 mg | Freq: Three times a day (TID) | INTRAMUSCULAR | Status: DC | PRN
  Administered 2020-07-22: 4 mg via INTRAVENOUS
  Filled 2020-07-22: qty 2

## 2020-07-22 MED ORDER — OXYCODONE HCL 5 MG PO TABS
5.0000 mg | ORAL_TABLET | ORAL | Status: DC | PRN
  Administered 2020-07-23: 5 mg via ORAL
  Filled 2020-07-22: qty 1

## 2020-07-22 MED ORDER — MENTHOL 3 MG MT LOZG: 1.0000 | LOZENGE | OROMUCOSAL | Status: DC | PRN

## 2020-07-22 MED ORDER — SODIUM CHLORIDE 0.9 % IV SOLN
300.0000 mg | Freq: Once | INTRAVENOUS | Status: AC
  Administered 2020-07-22: 300 mg via INTRAVENOUS
  Filled 2020-07-22: qty 15

## 2020-07-22 MED ORDER — NALOXONE HCL 4 MG/10ML IJ SOLN
1.0000 ug/kg/h | INTRAVENOUS | Status: DC | PRN
  Filled 2020-07-22: qty 5

## 2020-07-22 MED ORDER — ZOLPIDEM TARTRATE 5 MG PO TABS: 5.0000 mg | ORAL_TABLET | Freq: Every evening | ORAL | Status: DC | PRN

## 2020-07-22 MED ORDER — HYDROMORPHONE HCL 1 MG/ML IJ SOLN: 0.2000 mg | INTRAMUSCULAR | Status: DC | PRN

## 2020-07-22 MED ORDER — DIPHENHYDRAMINE HCL 25 MG PO CAPS: 25.0000 mg | ORAL_CAPSULE | Freq: Four times a day (QID) | ORAL | Status: DC | PRN

## 2020-07-22 NOTE — Lactation Note (Signed)
This note was copied from a baby's chart. Lactation Consultation Note  Patient Name: Bridget Bray [id number] Date: 07/22/2020 Reason for consult: Initial assessment;Early term 37-38.6wks Age:27 hours  Initial visit to 12 hours old infant of a P2 mother. Both parents present at the time the time of consult. Infant is sleeping next to mother upon arrival. Per parents, infant was very eager to feed overnight and the have used 5-7 mL to supplement after breastfeeding. Talked to mother about hand expression and demonstrated technique. Colostrum easily expressed. Infant started cueing. Set up support pillows for modified cradle position to left breast. Demonstrated alignment. Latched infant with ease. Noted suckling rhythmically with flanged lips, observed swallowing and breast tissue moving. Mother denies pain or discomfort.  Mother does not have a pump and LC provided a manual pump for supplementation purposes.    Plan: 1-Skin to skin 2-Aim for a deep, comfortable latch 3-Breastfeeding on demand or 8-12 times in 24h period. 4-Keep infant awake during breastfeeding session: massaging breast, infant's hand/shoulder/feet 5-Monitor voids and stools as signs good intake.  6-Encouraged maternal rest, hydration and food intake.  7-Contact LC as needed for feeds/support/concerns/questions   All questions answered at this time. Provided Lactation services brochure and promoted [employer] booklet information.   Maternal Data Has patient been taught Hand Expression?: Yes Does the patient have breastfeeding experience prior to this delivery?: Yes How long did the patient breastfeed?: 13 months  Feeding Mother's Current Feeding Choice: Breast Milk and Formula  LATCH Score Latch: Grasps breast easily, tongue down, lips flanged, rhythmical sucking.  Audible Swallowing: Spontaneous and intermittent  Type of Nipple: Everted at rest and after stimulation  Comfort (Breast/Nipple): Soft /  non-tender  Hold (Positioning): Assistance needed to correctly position infant at breast and maintain latch.  LATCH Score: 9   Lactation Tools Discussed/Used    Interventions Interventions: Breast feeding basics reviewed;Assisted with latch;Skin to skin;Breast massage;Hand express;Breast compression;Adjust position;Support pillows;Position options;Expressed milk;Hand pump;Education  Discharge Pump: Manual WIC Program: No (plans to apply)  Consult Status Consult Status: Follow-up Date: 07/23/20 Follow-up type: In-patient    Tequisha Maahs A Higuera Ancidey 07/22/2020, 10:14 AM

## 2020-07-22 NOTE — Progress Notes (Signed)
Patient dizzy with orthostatics, BP low. Will try again after patient has eaten something and kept down food

## 2020-07-22 NOTE — Progress Notes (Signed)
Subjective: Postpartum Day 1: Cesarean Delivery Patient reports feeling good, she denies pain.  She did have some light headedness and Dizziness when she stood up for the first time. She denies chest pain or shortness of breath.    Objective: Vital signs in last 24 hours: Temp:  [97.4 F (36.3 C)-98.6 F (37 C)] 98.1 F (36.7 C) (03/23 0515) Pulse Rate:  [68-109] 68 (03/23 0050) Resp:  [11-23] 16 (03/23 0515) BP: (86-107)/(48-83) 93/66 (03/23 0050) SpO2:  [98 %-100 %] 98 % (03/23 0515)  Physical Exam:  General: alert, cooperative, appears stated age and no distress Patient eating  Recent Labs    07/21/20 0930 07/22/20 0532  HGB 10.8* 8.0*  HCT 31.5* 23.8*    Assessment/Plan: Status post Cesarean section. Doing well postoperatively.  Patient with post operative anemia and some anemia symptoms, will  Give IV iron. Patient now eating and encouraged to sit out of bed once she feels strong enough.  Will continue routine post operative care.  Walda Pinn 07/22/2020, 11:28 AM

## 2020-07-23 DIAGNOSIS — O34211 Maternal care for low transverse scar from previous cesarean delivery: Secondary | ICD-10-CM | POA: Diagnosis not present

## 2020-07-23 DIAGNOSIS — D62 Acute posthemorrhagic anemia: Secondary | ICD-10-CM | POA: Diagnosis not present

## 2020-07-23 MED ORDER — IBUPROFEN 800 MG PO TABS: 800.0000 mg | ORAL_TABLET | Freq: Three times a day (TID) | ORAL | 0 refills | Status: DC

## 2020-07-23 MED ORDER — SIMETHICONE 80 MG PO CHEW
80.0000 mg | CHEWABLE_TABLET | ORAL | 0 refills | Status: AC | PRN
Start: 2020-07-23 — End: ?

## 2020-07-23 MED ORDER — OXYCODONE HCL 5 MG PO TABS: 5.0000 mg | ORAL_TABLET | ORAL | 0 refills | Status: DC | PRN

## 2020-07-23 NOTE — Lactation Note (Signed)
This note was copied from a baby's chart. Lactation Consultation Note  Patient Name: Bridget Bray [id number] Date: 07/23/2020 Reason for consult: Follow-up assessment;Infant weight loss;Early term 37-38.6wks;Nipple pain/trauma  Age:27 hours  Per mom nipples are feeling sensitive - LC offered to assess and no breakdown, or positional strips noted,. Areolas compressible.  LC reviewed hand expressing , several drops of transitional milk - breast warm and milk is coming in. [facility] reminded mom to apply her EBM to nipples liberally.  LC recommended to decrease on sensitivity - prior to latching - breast massage , hand express, pre - pump if needed and reverse pressure . Latch with firm support / football or cross cradle until the baby is opening wider.  LC also showed mom how to flip upper lip for a deeper latch and ease down the chin. Baby placed STS and LC assisted  Mom to latch in the cross cradle and baby fed 7 mins and released. With latch swallows noted and increased with breast compressions. Latch score - 8.  Per mom 1st baby was in NICU , and she can't remember if she experienced engorgement. LC reviewed prevention of soreness and engorgement.  Mom has a hand pump - with #24 F and #27 F.  Mom aware of the resources post D/C for LC .   Maternal Data Has patient been taught Hand Expression?: Yes  Feeding Mother's Current Feeding Choice: Breast Milk  LATCH Score Latch: Grasps breast easily, tongue down, lips flanged, rhythmical sucking.  Audible Swallowing: Spontaneous and intermittent  Type of Nipple: Everted at rest and after stimulation  Comfort (Breast/Nipple): Filling, red/small blisters or bruises, mild/mod discomfort  Hold (Positioning): Assistance needed to correctly position infant at breast and maintain latch.  LATCH Score: 8   Lactation Tools Discussed/Used Tools: Shells;Pump;Flanges;Coconut oil Flange Size: 24;27 Breast pump type: Manual Pump Education: Milk  Storage  Interventions Interventions: Breast feeding basics reviewed;Assisted with latch;Skin to skin;Breast massage;Reverse pressure;Adjust position;Support pillows;Position options;Expressed milk;Shells;Hand pump;Coconut oil  Discharge Discharge Education: Engorgement and breast care;Warning signs for feeding baby Pump: Manual  Consult Status Consult Status: Complete Date: 07/23/20    Kathrin Greathouse 07/23/2020, 10:40 AM

## 2020-07-23 NOTE — Progress Notes (Signed)
CSW received and acknowledges consult for EDPS of 10. RN contacted CSW and provided update that MOB was informed that CSW would follow up to discuss edinburgh score 10 and MOB declined. RN reported no concerns with MOB. CSW screening out referral. CSW available if any new concerns arise.   Celso Sickle, LCSW Clinical Social Worker [facility] Cell#: [phone]

## 2020-07-23 NOTE — Discharge Summary (Signed)
RCS OB Discharge Summary     Patient Name: Bridget Bray DOB: Nov 17, 1995 MRN: [id number]  Date of admission: 07/21/2020 Delivering MD: Hoover Browns  Date of delivery: 07/21/2020 Type of delivery: RCS  Newborn Data: Sex: Baby female Live born female  Birth Weight: 7 lb 2.1 oz (3235 g) APGAR: 10, 10  Newborn Delivery   Birth date/time: 07/21/2020 21:22:00 Delivery type: C-Section, Low Transverse Trial of labor: Yes C-section categorization: Repeat      Feeding: breast and bottle Infant being discharge to home with mother in stable condition.   Admitting diagnosis: Normal labor and delivery [O80] Intrauterine pregnancy: [redacted]w[redacted]d     Secondary diagnosis:  Active Problems:   Normal labor and delivery   Acute blood loss anemia   Encounter for maternal care for low transverse scar from repeat cesarean delivery   Normal postpartum course                                Complications: None                                                              Intrapartum Procedures: cesarean: low cervical, transverse Postpartum Procedures: none Complications-Operative and Postpartum: none Augmentation: N/A   History of Present Illness: Ms. Bridget Bray is a 25 y.o. female, [id number], who presents at [redacted]w[redacted]d weeks gestation. The patient has been followed at  [facility]  Her pregnancy has been complicated by:  Patient Active Problem List   Diagnosis Date Noted  . Acute blood loss anemia 07/23/2020  . Encounter for maternal care for low transverse scar from repeat cesarean delivery 07/23/2020  . Normal postpartum course 07/23/2020  . Normal labor and delivery 07/21/2020    Hospital course:  Sceduled C/S   25 y.o. yo [id number] at [redacted]w[redacted]d was admitted to the hospital 07/21/2020 for scheduled cesarean section with the following indication:Elective Repeat.Delivery details are as follows:  Membrane Rupture Time/Date: 6:30 AM ,07/21/2020   Delivery  Method:C-Section, Low Transverse  Details of operation can be found in separate operative note.  Patient had an uncomplicated postpartum course.  She is ambulating, tolerating a regular diet, passing flatus, and urinating well. Patient is discharged home in stable condition on  07/23/20        Newborn Data: Birth date:07/21/2020  Birth time:9:22 PM  Gender:Female  Living status:Living  Apgars:10 ,10  Weight:3235 g     Hospital Course--Scheduled Cesarean: Currently POD#2: Patient was admitted on 07/21/2020 for a scheduled RCS cesarean delivery.  Pt presented with desire to TOLAC but was SROM and found to be 4cm, she changed her mind and decided she desires a RCS. She was taken to the operating room, where Dr. Sallye Ober performed a Repeat LTCS under spinal anesthesia, with delivery of a viable baby female, with weight and Apgars as listed below. Infant was in good condition and remained at the patient's bedside.  The patient was taken to recovery in good condition.  Patient planned to breast and bottle feed.  On post-op day 1, patient was doing well, tolerating a regular diet, with Hgb of 10.8-8.0, s/p IV transfusion and currently on PO meds, asymptomatic.  Throughout her stay, her physical exam was  WNL, her incision was CDI, and her vital signs remained stable.  By post-op day 1, she was up ad lib, tolerating a regular diet, with good pain control with po med.  She was deemed to have received the full benefit of her hospital stay, and was discharged home in stable condition.  Contraceptive choice was po meds.    Physical exam  Vitals:   07/22/20 1000 07/22/20 1222 07/22/20 2125 07/23/20 0548  BP: (!) 83/42 (!) 91/50 (!) 78/58 91/60  Pulse: 72 79 80 95  Resp: 18 16 16 18   Temp: 98.1 F (36.7 C) 98.6 F (37 C) 98.1 F (36.7 C) 98.1 F (36.7 C)  TempSrc: Oral Oral Oral Oral  SpO2: 98% 98% 98%   Weight:      Height:       General: alert, cooperative and no distress Lochia: appropriate Uterine  Fundus: firm Incision: Healing well with no significant drainage, No significant erythema, Dressing is clean, dry, and intact, honeycomb dressing CDI Perineum: intact DVT Evaluation: No evidence of DVT seen on physical exam. Negative Homan's sign. No cords or calf tenderness. No significant calf/ankle edema.  Labs: Lab Results  Component Value Date   WBC 14.4 (H) 07/22/2020   HGB 8.0 (L) 07/22/2020   HCT 23.8 (L) 07/22/2020   MCV 86.5 07/22/2020   PLT 188 07/22/2020   CMP Latest Ref Rng & Units 02/15/2018  Glucose 65 - 99 mg/dL -  BUN 7 - 18 mg/dL -  Creatinine 02/17/2018 - 5.09 mg/dL 3.26  Sodium 7.12 - 458 mmol/L -  Potassium 3.5 - 5.1 mmol/L -  Chloride 98 - 107 mmol/L -  CO2 21 - 32 mmol/L -  Calcium 9.0 - 10.7 mg/dL -  Total Protein 6.4 - 8.6 g/dL -  Total Bilirubin 0.2 - 1.0 mg/dL -  Alkaline Phos Unit/L -  AST 0 - 26 Unit/L -  ALT 12 - 78 U/L -    Date of discharge: 07/23/2020 Discharge Diagnoses: Term Pregnancy-delivered Discharge instruction: per After Visit Summary and "Baby and Me Booklet".  After visit meds:   Activity:           unrestricted and pelvic rest Advance as tolerated. Pelvic rest for 6 weeks.  Diet:                routine Medications: PNV, Ibuprofen, Colace, Iron and tylenol, oxy IR Postpartum contraception: Undecided Condition:  Pt discharge to home with baby in stable and condition  Meds: Allergies as of 07/23/2020   No Known Allergies     Medication List    TAKE these medications   acetaminophen 500 MG tablet Commonly known as: TYLENOL Take 500 mg by mouth every 6 (six) hours as needed for mild pain or headache.   ferrous sulfate 325 (65 FE) MG tablet Take 325 mg by mouth daily with breakfast.   ibuprofen 800 MG tablet Commonly known as: ADVIL Take 1 tablet (800 mg total) by mouth every 8 (eight) hours.   oxyCODONE 5 MG immediate release tablet Commonly known as: Oxy IR/ROXICODONE Take 1-2 tablets (5-10 mg total) by mouth every 4  (four) hours as needed for moderate pain.   prenatal multivitamin Tabs tablet Take 1 tablet by mouth daily at 12 noon.   simethicone 80 MG chewable tablet Commonly known as: MYLICON Chew 1 tablet (80 mg total) by mouth as needed for flatulence.            Discharge Care Instructions  (From admission, onward)  Start     Ordered   07/23/20 0000  Discharge wound care:       Comments: Take dressing off on day 5-7 postpartum.  Report increased drainage, redness or warmth. Clean with water, let soap trickle down body. Can leave steri strips on until they fall off or take them off gently at day 10. Keep open to air, clean and dry.   07/23/20 1236          Discharge Follow Up:   Follow-up Information    [facility]. Schedule an appointment as soon as possible for a visit in 6 week(s).   Specialty: Obstetrics and Gynecology Contact information: [address] [place] [address] [phone]               [place], NP-C, CNM 07/23/2020, 12:36 PM  [place], FNP

## 2020-07-23 NOTE — Progress Notes (Signed)
Pt had a score of a 10 on the [place] postnatal depression scale. Discussed benefits of seeing clinical social work with patient.Patient states that she feels she has everything she needs at this time and does not want to see social work. Celso Sickle, CSW made aware.  Tylene Fantasia, RN

## 2020-07-31 DIAGNOSIS — Z419 Encounter for procedure for purposes other than remedying health state, unspecified: Secondary | ICD-10-CM | POA: Diagnosis not present

## 2020-08-07 ENCOUNTER — Encounter (HOSPITAL_COMMUNITY): Payer: Self-pay

## 2020-08-07 ENCOUNTER — Inpatient Hospital Stay (HOSPITAL_COMMUNITY): Admit: 2020-08-07 | Payer: BC Managed Care – PPO | Admitting: Obstetrics

## 2020-08-07 SURGERY — Surgical Case
Anesthesia: Spinal

## 2020-08-30 DIAGNOSIS — Z419 Encounter for procedure for purposes other than remedying health state, unspecified: Secondary | ICD-10-CM | POA: Diagnosis not present

## 2020-09-30 DIAGNOSIS — Z419 Encounter for procedure for purposes other than remedying health state, unspecified: Secondary | ICD-10-CM | POA: Diagnosis not present

## 2020-10-30 DIAGNOSIS — Z419 Encounter for procedure for purposes other than remedying health state, unspecified: Secondary | ICD-10-CM | POA: Diagnosis not present

## 2020-11-27 DIAGNOSIS — Z3689 Encounter for other specified antenatal screening: Secondary | ICD-10-CM | POA: Diagnosis not present

## 2020-11-27 LAB — OB RESULTS CONSOLE HIV ANTIBODY (ROUTINE TESTING): HIV: NONREACTIVE

## 2020-11-27 LAB — OB RESULTS CONSOLE HEPATITIS B SURFACE ANTIGEN: Hepatitis B Surface Ag: NEGATIVE

## 2020-11-27 LAB — OB RESULTS CONSOLE ABO/RH: RH Type: POSITIVE

## 2020-11-27 LAB — OB RESULTS CONSOLE VARICELLA ZOSTER ANTIBODY, IGG: Varicella: IMMUNE

## 2020-11-27 LAB — OB RESULTS CONSOLE GC/CHLAMYDIA
Chlamydia: NEGATIVE
Gonorrhea: NEGATIVE

## 2020-11-27 LAB — OB RESULTS CONSOLE ANTIBODY SCREEN: Antibody Screen: NEGATIVE

## 2020-11-27 LAB — OB RESULTS CONSOLE RPR: RPR: NONREACTIVE

## 2020-11-27 LAB — OB RESULTS CONSOLE RUBELLA ANTIBODY, IGM: Rubella: IMMUNE

## 2020-11-30 DIAGNOSIS — Z419 Encounter for procedure for purposes other than remedying health state, unspecified: Secondary | ICD-10-CM | POA: Diagnosis not present

## 2020-12-31 DIAGNOSIS — Z419 Encounter for procedure for purposes other than remedying health state, unspecified: Secondary | ICD-10-CM | POA: Diagnosis not present

## 2021-01-30 DIAGNOSIS — Z419 Encounter for procedure for purposes other than remedying health state, unspecified: Secondary | ICD-10-CM | POA: Diagnosis not present

## 2021-02-08 ENCOUNTER — Encounter (HOSPITAL_COMMUNITY): Payer: Self-pay

## 2021-02-08 ENCOUNTER — Inpatient Hospital Stay (HOSPITAL_COMMUNITY)
Admission: AD | Admit: 2021-02-08 | Discharge: 2021-02-08 | Disposition: A | Discharge status: Home / Self Care | Payer: Commercial Managed Care - PPO | Attending: Obstetrics and Gynecology | Admitting: Obstetrics and Gynecology

## 2021-02-08 DIAGNOSIS — M25511 Pain in right shoulder: Secondary | ICD-10-CM

## 2021-02-08 DIAGNOSIS — O9A212 Injury, poisoning and certain other consequences of external causes complicating pregnancy, second trimester: Secondary | ICD-10-CM

## 2021-02-08 DIAGNOSIS — Z3A15 15 weeks gestation of pregnancy: Secondary | ICD-10-CM | POA: Diagnosis not present

## 2021-02-08 DIAGNOSIS — W010XXA Fall on same level from slipping, tripping and stumbling without subsequent striking against object, initial encounter: Secondary | ICD-10-CM | POA: Diagnosis not present

## 2021-02-08 DIAGNOSIS — Y93E9 Activity, other interior property and clothing maintenance: Secondary | ICD-10-CM | POA: Insufficient documentation

## 2021-02-08 DIAGNOSIS — O26892 Other specified pregnancy related conditions, second trimester: Secondary | ICD-10-CM | POA: Insufficient documentation

## 2021-02-08 DIAGNOSIS — Y92009 Unspecified place in unspecified non-institutional (private) residence as the place of occurrence of the external cause: Secondary | ICD-10-CM | POA: Diagnosis not present

## 2021-02-08 DIAGNOSIS — W19XXXA Unspecified fall, initial encounter: Secondary | ICD-10-CM

## 2021-02-08 NOTE — MAU Provider Note (Signed)
History     CSN: [id number]  Arrival date and time: 02/08/21 1954   Event Date/Time   First Provider Initiated Contact with Patient 02/08/21 2102      Chief Complaint  Patient presents with   Fall   HPI  Ms.Rebeca Sendejo is a 25 y.o. female [phone] @ [redacted]w[redacted]d here in MAU after a fall that occurred at home today at 1800. She was cleaning her home and slipped and fell onto her left buttocks and right shoulder. She has full range of motion in both areas. She feels sore, however does not have any pain in her abdomen. She has no bleeding.   OB History     Gravida  4   Para  2   Term  2   Preterm      AB  1   Living  2      SAB  1   IAB      Ectopic      Multiple  0   Live Births  2           Past Medical History:  Diagnosis Date   Anemia    Medical history non-contributory     Past Surgical History:  Procedure Laterality Date   CESAREAN SECTION N/A 02/14/2018   Procedure: CESAREAN SECTION;  Surgeon: Tereso Newcomer, MD;  Location: [facility];  Service: Obstetrics;  Laterality: N/A;   CESAREAN SECTION N/A 07/21/2020   Procedure: CESAREAN SECTION;  Surgeon: Hoover Browns, MD;  Location: [facility] LD ORS;  Service: Obstetrics;  Laterality: N/A;   TENDON REPAIR Right 09/05/2012   Procedure: NERVE AND TENDON REPAIR;  Surgeon: Sharma Covert, MD;  Location: [facility] OR;  Service: Orthopedics;  Laterality: Right;   WOUND EXPLORATION Right 09/05/2012   Procedure: WOUND EXPLORATION OF RIGHT WRIST;  Surgeon: Sharma Covert, MD;  Location: [facility] OR;  Service: Orthopedics;  Laterality: Right;    Family History  Problem Relation Age of Onset   Healthy Mother    Healthy Father    ADD / ADHD Neg Hx    Alcohol abuse Neg Hx    Anxiety disorder Neg Hx    Arthritis Neg Hx    Asthma Neg Hx    Birth defects Neg Hx    Cancer Neg Hx    COPD Neg Hx    Depression Neg Hx    Diabetes Neg Hx    Drug abuse Neg Hx    Early death Neg Hx    Hearing loss Neg Hx    Heart disease  Neg Hx    Hyperlipidemia Neg Hx    Hypertension Neg Hx    Kidney disease Neg Hx    Intellectual disability Neg Hx    Learning disabilities Neg Hx    Miscarriages / Stillbirths Neg Hx    Stroke Neg Hx    Obesity Neg Hx    Vision loss Neg Hx    Varicose Veins Neg Hx     Social History   Tobacco Use   Smoking status: Never   Smokeless tobacco: Never  Vaping Use   Vaping Use: Never used  Substance Use Topics   Alcohol use: No   Drug use: No    Allergies: No Known Allergies  Medications Prior to Admission  Medication Sig Dispense Refill Last Dose   acetaminophen (TYLENOL) 500 MG tablet Take 500 mg by mouth every 6 (six) hours as needed for mild pain or headache.      ferrous  sulfate 325 (65 FE) MG tablet Take 325 mg by mouth daily with breakfast.      ibuprofen (ADVIL) 800 MG tablet Take 1 tablet (800 mg total) by mouth every 8 (eight) hours. 30 tablet 0    oxyCODONE (OXY IR/ROXICODONE) 5 MG immediate release tablet Take 1-2 tablets (5-10 mg total) by mouth every 4 (four) hours as needed for moderate pain. 30 tablet 0    Prenatal Vit-Fe Fumarate-FA (PRENATAL MULTIVITAMIN) TABS tablet Take 1 tablet by mouth daily at 12 noon.      simethicone (MYLICON) 80 MG chewable tablet Chew 1 tablet (80 mg total) by mouth as needed for flatulence. 30 tablet 0    No results found for this or any previous visit (from the past 48 hour(s)).   Review of Systems  Gastrointestinal:  Negative for abdominal pain.  Genitourinary:  Negative for vaginal bleeding and vaginal discharge.  Musculoskeletal:  Positive for myalgias. Negative for gait problem.  Physical Exam   Blood pressure 106/70, pulse 99, temperature 98.1 F (36.7 C), temperature source Oral, resp. rate 17, height 4\' 11"  (1.499 m), weight 66.6 kg, SpO2 100 %, unknown if currently breastfeeding.  Physical Exam Constitutional:      General: She is not in acute distress.    Appearance: Normal appearance. She is not ill-appearing,  toxic-appearing or diaphoretic.  Musculoskeletal:     Right shoulder: Tenderness present. No swelling, deformity or bony tenderness. Normal range of motion.       Arms:  Skin:    General: Skin is warm.  Neurological:     Mental Status: She is alert and oriented to person, place, and time.  Psychiatric:        Behavior: Behavior normal.   MAU Course  Procedures  MDM  + fetal heart tones via doppler O positive blood type   Assessment and Plan   A:  1. Fall, initial encounter   2. [redacted] weeks gestation of pregnancy      P:  Discharge home in stable condition Heart tones heart Return to MAU with worsening pain/bleeding   Akili Corsetti, , NP 02/08/2021 9:20 PM

## 2021-02-08 NOTE — MAU Note (Signed)
..  Bridget Bray is a 25 y.o. at [redacted]w[redacted]d here in MAU reporting: she was moving some clothes to the bedroom and slipped on some water that her daughter had dropped. She hit her right hip and her back.  Reports hip, back, and lower abdominal pain.  Abdominal pain is constant cramp-like, denies vaginal bleeding.  LMP: Pain score: hip pain- 3/10; back- 5/10; abdominal- 2/10 Vitals:   02/08/21 2008  BP: 106/70  Pulse: 99  Resp: 17  Temp: 98.1 F (36.7 C)  SpO2: 100%     FHT: 155

## 2021-03-02 DIAGNOSIS — Z419 Encounter for procedure for purposes other than remedying health state, unspecified: Secondary | ICD-10-CM | POA: Diagnosis not present

## 2021-04-01 DIAGNOSIS — Z419 Encounter for procedure for purposes other than remedying health state, unspecified: Secondary | ICD-10-CM | POA: Diagnosis not present

## 2021-05-02 DIAGNOSIS — Z419 Encounter for procedure for purposes other than remedying health state, unspecified: Secondary | ICD-10-CM | POA: Diagnosis not present

## 2021-06-02 DIAGNOSIS — Z419 Encounter for procedure for purposes other than remedying health state, unspecified: Secondary | ICD-10-CM | POA: Diagnosis not present

## 2021-06-11 ENCOUNTER — Other Ambulatory Visit: Payer: Self-pay | Admitting: Obstetrics

## 2021-06-30 DIAGNOSIS — Z419 Encounter for procedure for purposes other than remedying health state, unspecified: Secondary | ICD-10-CM | POA: Diagnosis not present

## 2021-07-07 ENCOUNTER — Other Ambulatory Visit: Payer: Self-pay

## 2021-07-07 ENCOUNTER — Inpatient Hospital Stay (HOSPITAL_COMMUNITY)
Admission: AD | Admit: 2021-07-07 | Discharge: 2021-07-10 | DRG: 786 | Disposition: A | Discharge status: Home / Self Care | Payer: Commercial Managed Care - PPO | Attending: Obstetrics and Gynecology | Admitting: Obstetrics and Gynecology

## 2021-07-07 ENCOUNTER — Encounter (HOSPITAL_COMMUNITY)
Admission: AD | Disposition: A | Discharge status: Home / Self Care | Payer: Self-pay | Source: Home / Self Care | Attending: Obstetrics and Gynecology

## 2021-07-07 ENCOUNTER — Inpatient Hospital Stay (HOSPITAL_COMMUNITY): Payer: Commercial Managed Care - PPO | Admitting: Anesthesiology

## 2021-07-07 ENCOUNTER — Encounter (HOSPITAL_COMMUNITY): Payer: Self-pay | Admitting: Obstetrics and Gynecology

## 2021-07-07 DIAGNOSIS — O42913 Preterm premature rupture of membranes, unspecified as to length of time between rupture and onset of labor, third trimester: Principal | ICD-10-CM | POA: Diagnosis present

## 2021-07-07 DIAGNOSIS — O34211 Maternal care for low transverse scar from previous cesarean delivery: Secondary | ICD-10-CM

## 2021-07-07 DIAGNOSIS — O26893 Other specified pregnancy related conditions, third trimester: Secondary | ICD-10-CM | POA: Diagnosis not present

## 2021-07-07 DIAGNOSIS — O34219 Maternal care for unspecified type scar from previous cesarean delivery: Secondary | ICD-10-CM | POA: Diagnosis not present

## 2021-07-07 DIAGNOSIS — Z20822 Contact with and (suspected) exposure to covid-19: Secondary | ICD-10-CM | POA: Diagnosis not present

## 2021-07-07 DIAGNOSIS — Z3A36 36 weeks gestation of pregnancy: Secondary | ICD-10-CM

## 2021-07-07 DIAGNOSIS — D509 Iron deficiency anemia, unspecified: Secondary | ICD-10-CM | POA: Diagnosis not present

## 2021-07-07 DIAGNOSIS — O9902 Anemia complicating childbirth: Secondary | ICD-10-CM | POA: Diagnosis not present

## 2021-07-07 DIAGNOSIS — Z23 Encounter for immunization: Secondary | ICD-10-CM | POA: Diagnosis not present

## 2021-07-07 DIAGNOSIS — D649 Anemia, unspecified: Secondary | ICD-10-CM | POA: Diagnosis not present

## 2021-07-07 DIAGNOSIS — Z98891 History of uterine scar from previous surgery: Secondary | ICD-10-CM

## 2021-07-07 DIAGNOSIS — Z3A00 Weeks of gestation of pregnancy not specified: Secondary | ICD-10-CM | POA: Diagnosis not present

## 2021-07-07 HISTORY — DX: Herpesviral infection, unspecified: B00.9

## 2021-07-07 LAB — CBC
HCT: 36.2 % (ref 36.0–46.0)
HCT: 29 % — ABNORMAL LOW (ref 36.0–46.0)
Hemoglobin: 12.3 g/dL (ref 12.0–15.0)
Hemoglobin: 9.9 g/dL — ABNORMAL LOW (ref 12.0–15.0)
MCH: 30.4 pg (ref 26.0–34.0)
MCH: 30.3 pg (ref 26.0–34.0)
MCHC: 34 g/dL (ref 30.0–36.0)
MCHC: 34.1 g/dL (ref 30.0–36.0)
MCV: 89.4 fL (ref 80.0–100.0)
MCV: 88.7 fL (ref 80.0–100.0)
Platelets: 201 10*3/uL (ref 150–400)
Platelets: 187 10*3/uL (ref 150–400)
RBC: 4.05 MIL/uL (ref 3.87–5.11)
RBC: 3.27 MIL/uL — ABNORMAL LOW (ref 3.87–5.11)
RDW: 14.4 % (ref 11.5–15.5)
RDW: 14.2 % (ref 11.5–15.5)
WBC: 8.2 10*3/uL (ref 4.0–10.5)
WBC: 13.2 10*3/uL — ABNORMAL HIGH (ref 4.0–10.5)
nRBC: 0 % (ref 0.0–0.2)
nRBC: 0 % (ref 0.0–0.2)

## 2021-07-07 LAB — COMPREHENSIVE METABOLIC PANEL
ALT: 8 U/L (ref 0–44)
AST: 15 U/L (ref 15–41)
Albumin: 2.2 g/dL — ABNORMAL LOW (ref 3.5–5.0)
Alkaline Phosphatase: 78 U/L (ref 38–126)
Anion gap: 7 (ref 5–15)
BUN: <5 mg/dL — ABNORMAL LOW (ref 6–20)
CO2: 22 mmol/L (ref 22–32)
Calcium: 8.3 mg/dL — ABNORMAL LOW (ref 8.9–10.3)
Chloride: 105 mmol/L (ref 98–111)
Creatinine, Ser: 0.58 mg/dL (ref 0.44–1.00)
GFR, Estimated: >60 mL/min (ref >60)
Glucose, Bld: 119 mg/dL — ABNORMAL HIGH (ref 70–99)
Potassium: 4.1 mmol/L (ref 3.5–5.1)
Sodium: 134 mmol/L — ABNORMAL LOW (ref 135–145)
Total Bilirubin: 0.3 mg/dL (ref 0.3–1.2)
Total Protein: 4.6 g/dL — ABNORMAL LOW (ref 6.5–8.1)

## 2021-07-07 LAB — RESP PANEL BY RT-PCR (FLU A&B, COVID) ARPGX2
Influenza A by PCR: NEGATIVE
Influenza B by PCR: NEGATIVE
SARS Coronavirus 2 by RT PCR: NEGATIVE

## 2021-07-07 LAB — TYPE AND SCREEN
ABO/RH(D): O POS
Antibody Screen: NEGATIVE

## 2021-07-07 LAB — RPR: RPR Ser Ql: NONREACTIVE

## 2021-07-07 LAB — POCT FERN TEST: POCT Fern Test: POSITIVE

## 2021-07-07 SURGERY — Surgical Case
Anesthesia: Spinal

## 2021-07-07 MED ORDER — LACTATED RINGERS IV BOLUS
1000.0000 mL | Freq: Once | INTRAVENOUS | Status: AC
  Administered 2021-07-07: 1000 mL via INTRAVENOUS

## 2021-07-07 MED ORDER — NALOXONE HCL 0.4 MG/ML IJ SOLN: 0.4000 mg | INTRAMUSCULAR | Status: DC | PRN

## 2021-07-07 MED ORDER — EPHEDRINE SULFATE-NACL 50-0.9 MG/10ML-% IV SOSY
PREFILLED_SYRINGE | INTRAVENOUS | Status: DC | PRN
  Administered 2021-07-07 (×2): 5 mg via INTRAVENOUS

## 2021-07-07 MED ORDER — STERILE WATER FOR IRRIGATION IR SOLN
Status: DC | PRN
Start: 2021-07-07 — End: 2021-07-07
  Administered 2021-07-07: 1

## 2021-07-07 MED ORDER — ONDANSETRON HCL 4 MG/2ML IJ SOLN: 4.0000 mg | Freq: Four times a day (QID) | INTRAMUSCULAR | Status: DC

## 2021-07-07 MED ORDER — LACTATED RINGERS IV SOLN: INTRAVENOUS | Status: DC

## 2021-07-07 MED ORDER — SCOPOLAMINE 1 MG/3DAYS TD PT72
MEDICATED_PATCH | TRANSDERMAL | Status: AC
  Filled 2021-07-07: qty 1

## 2021-07-07 MED ORDER — MENTHOL 3 MG MT LOZG: 1.0000 | LOZENGE | OROMUCOSAL | Status: DC | PRN

## 2021-07-07 MED ORDER — IBUPROFEN 600 MG PO TABS
600.0000 mg | ORAL_TABLET | Freq: Four times a day (QID) | ORAL | Status: DC
  Administered 2021-07-08 – 2021-07-10 (×9): 600 mg via ORAL
  Filled 2021-07-07 (×9): qty 1

## 2021-07-07 MED ORDER — FAMOTIDINE IN NACL 20-0.9 MG/50ML-% IV SOLN
20.0000 mg | Freq: Once | INTRAVENOUS | Status: AC
Start: 2021-07-07 — End: 2021-07-07
  Administered 2021-07-07: 20 mg via INTRAVENOUS
  Filled 2021-07-07: qty 50

## 2021-07-07 MED ORDER — ACETAMINOPHEN 10 MG/ML IV SOLN
INTRAVENOUS | Status: AC
  Filled 2021-07-07: qty 100

## 2021-07-07 MED ORDER — DIPHENHYDRAMINE HCL 25 MG PO CAPS: 25.0000 mg | ORAL_CAPSULE | Freq: Four times a day (QID) | ORAL | Status: DC | PRN

## 2021-07-07 MED ORDER — SENNOSIDES-DOCUSATE SODIUM 8.6-50 MG PO TABS
2.0000 | ORAL_TABLET | Freq: Every day | ORAL | Status: DC
  Administered 2021-07-08 – 2021-07-10 (×3): 2 via ORAL
  Filled 2021-07-07 (×3): qty 2

## 2021-07-07 MED ORDER — SIMETHICONE 80 MG PO CHEW
80.0000 mg | CHEWABLE_TABLET | Freq: Three times a day (TID) | ORAL | Status: DC
  Administered 2021-07-08 – 2021-07-10 (×6): 80 mg via ORAL
  Filled 2021-07-07 (×6): qty 1

## 2021-07-07 MED ORDER — FENTANYL CITRATE (PF) 100 MCG/2ML IJ SOLN
INTRAMUSCULAR | Status: DC | PRN
  Administered 2021-07-07: 15 ug via INTRATHECAL

## 2021-07-07 MED ORDER — SODIUM CHLORIDE 0.9% FLUSH: 3.0000 mL | INTRAVENOUS | Status: DC | PRN

## 2021-07-07 MED ORDER — EPHEDRINE 5 MG/ML INJ
INTRAVENOUS | Status: AC
  Filled 2021-07-07: qty 5

## 2021-07-07 MED ORDER — SIMETHICONE 80 MG PO CHEW: 80.0000 mg | CHEWABLE_TABLET | ORAL | Status: DC | PRN

## 2021-07-07 MED ORDER — PHENYLEPHRINE HCL-NACL 20-0.9 MG/250ML-% IV SOLN
INTRAVENOUS | Status: AC
  Filled 2021-07-07: qty 250

## 2021-07-07 MED ORDER — SCOPOLAMINE 1 MG/3DAYS TD PT72: MEDICATED_PATCH | TRANSDERMAL | Status: DC | PRN

## 2021-07-07 MED ORDER — KETOROLAC TROMETHAMINE 30 MG/ML IJ SOLN: 30.0000 mg | Freq: Four times a day (QID) | INTRAMUSCULAR | Status: DC | PRN

## 2021-07-07 MED ORDER — FENTANYL CITRATE (PF) 100 MCG/2ML IJ SOLN
100.0000 ug | Freq: Once | INTRAMUSCULAR | Status: AC
  Administered 2021-07-07: 100 ug via INTRAVENOUS
  Filled 2021-07-07: qty 2

## 2021-07-07 MED ORDER — OXYTOCIN-SODIUM CHLORIDE 30-0.9 UT/500ML-% IV SOLN
INTRAVENOUS | Status: DC | PRN
  Administered 2021-07-07: 30 [IU] via INTRAVENOUS

## 2021-07-07 MED ORDER — COCONUT OIL OIL: 1.0000 "application " | TOPICAL_OIL | Status: DC | PRN

## 2021-07-07 MED ORDER — DEXAMETHASONE SODIUM PHOSPHATE 4 MG/ML IJ SOLN
INTRAMUSCULAR | Status: DC | PRN
  Administered 2021-07-07: 4 mg via INTRAVENOUS

## 2021-07-07 MED ORDER — LACTATED RINGERS IV SOLN
INTRAVENOUS | Status: DC | PRN
Start: 2021-07-07 — End: 2021-07-07

## 2021-07-07 MED ORDER — OXYTOCIN-SODIUM CHLORIDE 30-0.9 UT/500ML-% IV SOLN
2.5000 [IU]/h | INTRAVENOUS | Status: AC
  Administered 2021-07-07: 2.5 [IU]/h via INTRAVENOUS
  Filled 2021-07-07: qty 500

## 2021-07-07 MED ORDER — FENTANYL CITRATE (PF) 100 MCG/2ML IJ SOLN
INTRAMUSCULAR | Status: AC
  Filled 2021-07-07: qty 2

## 2021-07-07 MED ORDER — DIPHENHYDRAMINE HCL 50 MG/ML IJ SOLN: 12.5000 mg | INTRAMUSCULAR | Status: DC | PRN

## 2021-07-07 MED ORDER — MORPHINE SULFATE (PF) 0.5 MG/ML IJ SOLN
INTRAMUSCULAR | Status: DC | PRN
  Administered 2021-07-07: 150 ug via EPIDURAL

## 2021-07-07 MED ORDER — ONDANSETRON HCL 4 MG/2ML IJ SOLN
4.0000 mg | Freq: Four times a day (QID) | INTRAMUSCULAR | Status: DC | PRN
  Administered 2021-07-07 (×2): 4 mg via INTRAVENOUS
  Filled 2021-07-07 (×2): qty 2

## 2021-07-07 MED ORDER — LACTATED RINGERS IV BOLUS
500.0000 mL | Freq: Once | INTRAVENOUS | Status: AC
  Administered 2021-07-07: 500 mL via INTRAVENOUS

## 2021-07-07 MED ORDER — CEFAZOLIN SODIUM-DEXTROSE 2-4 GM/100ML-% IV SOLN
2.0000 g | INTRAVENOUS | Status: DC
  Filled 2021-07-07: qty 100

## 2021-07-07 MED ORDER — NALOXONE HCL 4 MG/10ML IJ SOLN
1.0000 ug/kg/h | INTRAVENOUS | Status: DC | PRN
  Filled 2021-07-07: qty 5

## 2021-07-07 MED ORDER — DIPHENHYDRAMINE HCL 25 MG PO CAPS: 25.0000 mg | ORAL_CAPSULE | ORAL | Status: DC | PRN

## 2021-07-07 MED ORDER — ACETAMINOPHEN 500 MG PO TABS: 1000.0000 mg | ORAL_TABLET | Freq: Four times a day (QID) | ORAL | Status: DC

## 2021-07-07 MED ORDER — OXYCODONE HCL 5 MG PO TABS: 5.0000 mg | ORAL_TABLET | ORAL | Status: DC | PRN

## 2021-07-07 MED ORDER — SOD CITRATE-CITRIC ACID 500-334 MG/5ML PO SOLN
30.0000 mL | Freq: Once | ORAL | Status: AC
  Administered 2021-07-07: 30 mL via ORAL
  Filled 2021-07-07: qty 30

## 2021-07-07 MED ORDER — KETOROLAC TROMETHAMINE 30 MG/ML IJ SOLN
30.0000 mg | Freq: Four times a day (QID) | INTRAMUSCULAR | Status: AC
  Administered 2021-07-07 – 2021-07-08 (×3): 30 mg via INTRAVENOUS
  Filled 2021-07-07 (×4): qty 1

## 2021-07-07 MED ORDER — BUPIVACAINE IN DEXTROSE 0.75-8.25 % IT SOLN
INTRATHECAL | Status: DC | PRN
  Administered 2021-07-07: 1.6 mL via INTRATHECAL

## 2021-07-07 MED ORDER — ACETAMINOPHEN 10 MG/ML IV SOLN
INTRAVENOUS | Status: DC | PRN
  Administered 2021-07-07: 1000 mg via INTRAVENOUS

## 2021-07-07 MED ORDER — ONDANSETRON HCL 4 MG/2ML IJ SOLN
INTRAMUSCULAR | Status: AC
  Filled 2021-07-07: qty 2

## 2021-07-07 MED ORDER — DIBUCAINE (PERIANAL) 1 % EX OINT: 1.0000 "application " | TOPICAL_OINTMENT | CUTANEOUS | Status: DC | PRN

## 2021-07-07 MED ORDER — SODIUM CHLORIDE 0.9 % IV SOLN: INTRAVENOUS | Status: DC | PRN

## 2021-07-07 MED ORDER — ONDANSETRON HCL 4 MG/2ML IJ SOLN
INTRAMUSCULAR | Status: DC | PRN
  Administered 2021-07-07: 4 mg via INTRAVENOUS

## 2021-07-07 MED ORDER — ACETAMINOPHEN 500 MG PO TABS
1000.0000 mg | ORAL_TABLET | Freq: Four times a day (QID) | ORAL | Status: AC
  Administered 2021-07-08: 13:00:00 1000 mg via ORAL
  Filled 2021-07-07 (×2): qty 2

## 2021-07-07 MED ORDER — ONDANSETRON HCL 4 MG/2ML IJ SOLN: 4.0000 mg | Freq: Three times a day (TID) | INTRAMUSCULAR | Status: DC | PRN

## 2021-07-07 MED ORDER — SCOPOLAMINE 1 MG/3DAYS TD PT72
MEDICATED_PATCH | TRANSDERMAL | Status: DC | PRN
  Administered 2021-07-07: 1 via TRANSDERMAL

## 2021-07-07 MED ORDER — PHENYLEPHRINE HCL-NACL 20-0.9 MG/250ML-% IV SOLN
INTRAVENOUS | Status: DC | PRN
  Administered 2021-07-07: 60 ug/min via INTRAVENOUS

## 2021-07-07 MED ORDER — TETANUS-DIPHTH-ACELL PERTUSSIS 5-2.5-18.5 LF-MCG/0.5 IM SUSY: 0.5000 mL | PREFILLED_SYRINGE | Freq: Once | INTRAMUSCULAR | Status: DC

## 2021-07-07 MED ORDER — CEFAZOLIN SODIUM-DEXTROSE 2-3 GM-%(50ML) IV SOLR
INTRAVENOUS | Status: DC | PRN
  Administered 2021-07-07: 2 g via INTRAVENOUS

## 2021-07-07 MED ORDER — OXYCODONE HCL 5 MG PO TABS: 5.0000 mg | ORAL_TABLET | Freq: Four times a day (QID) | ORAL | Status: DC | PRN

## 2021-07-07 MED ORDER — SODIUM CHLORIDE 0.9 % IR SOLN
Status: DC | PRN
Start: 2021-07-07 — End: 2021-07-07
  Administered 2021-07-07: 1

## 2021-07-07 MED ORDER — WITCH HAZEL-GLYCERIN EX PADS: 1.0000 "application " | MEDICATED_PAD | CUTANEOUS | Status: DC | PRN

## 2021-07-07 MED ORDER — PRENATAL MULTIVITAMIN CH
1.0000 | ORAL_TABLET | Freq: Every day | ORAL | Status: DC
  Administered 2021-07-08 – 2021-07-10 (×3): 1 via ORAL
  Filled 2021-07-07 (×3): qty 1

## 2021-07-07 MED ORDER — MORPHINE SULFATE (PF) 0.5 MG/ML IJ SOLN
INTRAMUSCULAR | Status: AC
  Filled 2021-07-07: qty 10

## 2021-07-07 SURGICAL SUPPLY — 36 items
BENZOIN TINCTURE PRP APPL 2/3 (GAUZE/BANDAGES/DRESSINGS) ×2 IMPLANT
CHLORAPREP W/TINT 26ML (MISCELLANEOUS) ×2 IMPLANT
CLAMP CORD UMBIL (MISCELLANEOUS) IMPLANT
CLOTH BEACON ORANGE TIMEOUT ST (SAFETY) ×2 IMPLANT
DRSG OPSITE POSTOP 4X10 (GAUZE/BANDAGES/DRESSINGS) ×2 IMPLANT
ELECT REM PT RETURN 9FT ADLT (ELECTROSURGICAL) ×2
ELECTRODE REM PT RTRN 9FT ADLT (ELECTROSURGICAL) ×1 IMPLANT
EXTRACTOR VACUUM KIWI (MISCELLANEOUS) ×2 IMPLANT
GLOVE SURG UNDER POLY LF SZ6 (GLOVE) ×2 IMPLANT
GLOVE SURG UNDER POLY LF SZ6.5 (GLOVE) ×2 IMPLANT
GLOVE SURG UNDER POLY LF SZ7 (GLOVE) ×4 IMPLANT
GOWN STRL REUS W/TWL LRG LVL3 (GOWN DISPOSABLE) ×4 IMPLANT
KIT ABG SYR 3ML LUER SLIP (SYRINGE) IMPLANT
NDL HYPO 25X5/8 SAFETYGLIDE (NEEDLE) IMPLANT
NEEDLE HYPO 25X5/8 SAFETYGLIDE (NEEDLE) IMPLANT
NS IRRIG 1000ML POUR BTL (IV SOLUTION) ×2 IMPLANT
PACK C SECTION WH (CUSTOM PROCEDURE TRAY) ×2 IMPLANT
PAD OB MATERNITY 4.3X12.25 (PERSONAL CARE ITEMS) ×2 IMPLANT
PENCIL SMOKE EVAC W/HOLSTER (ELECTROSURGICAL) ×2 IMPLANT
RETRACTOR WND ALEXIS 25 LRG (MISCELLANEOUS) IMPLANT
RTRCTR C-SECT PINK 25CM LRG (MISCELLANEOUS) ×1 IMPLANT
RTRCTR WOUND ALEXIS 25CM LRG (MISCELLANEOUS)
STRIP CLOSURE SKIN 1/2X4 (GAUZE/BANDAGES/DRESSINGS) ×1 IMPLANT
SUT MNCRL 0 VIOLET CTX 36 (SUTURE) ×2 IMPLANT
SUT MONOCRYL 0 CTX 36 (SUTURE) ×2
SUT PLAIN 2 0 XLH (SUTURE) IMPLANT
SUT VIC AB 0 CT1 36 (SUTURE) ×5 IMPLANT
SUT VIC AB 2-0 CT1 27 (SUTURE) ×1
SUT VIC AB 2-0 CT1 TAPERPNT 27 (SUTURE) IMPLANT
SUT VIC AB 3-0 CT1 27 (SUTURE)
SUT VIC AB 3-0 CT1 TAPERPNT 27 (SUTURE) IMPLANT
SUT VIC AB 4-0 KS 27 (SUTURE) ×1 IMPLANT
SUT VIC AB 4-0 PS2 27 (SUTURE) ×2 IMPLANT
TOWEL OR 17X24 6PK STRL BLUE (TOWEL DISPOSABLE) ×2 IMPLANT
TRAY FOLEY W/BAG SLVR 14FR LF (SET/KITS/TRAYS/PACK) IMPLANT
WATER STERILE IRR 1000ML POUR (IV SOLUTION) ×2 IMPLANT

## 2021-07-07 NOTE — Progress Notes (Signed)
Spoke with Dorisann Frames, CNM regarding patients low BP's. She is aware of current ones, refer to flowsheet. Pt complains of feeling jittery but says she has felt like that all day. Earlier she was nauseous and vomiting so she has only eaten one bowl of soup and maybe 6 packs of crackers. Offered the patient juice, she declines and only requested water. CNM aware of all the above. Verbal order given for continuous LR @ 125 ml/hr and STAT CBC and CMP. Will call back if labs abnormal per CNM.  ?

## 2021-07-07 NOTE — Lactation Note (Signed)
This note was copied from a baby's chart. ?Lactation Consultation Note ? ?Patient Name: Bridget Bray ?Today's Date: 07/07/2021 ?Reason for consult: Initial assessment;Mother's request;Late-preterm 34-36.6wks;Breastfeeding assistance ?Age:26 years ?Mom had infant latched in cradle hold on and off for 10-15 min. LC assisted with securing cradle hold with Mom holding buttocks to get more depth on the breast. Infant fed on and off and then fell asleep.  ? ?LPTI guidelines reviewed to reduce calorie loss including keeping total feeding under 30 min.  ? ?Plan. 1. To feed based on cues 8-12x 24hr period. Mom to offer breasts and look for signs of milk transfer.  ?2. Mom to supplement 5-10 ml per feeding. Mom aware < 6 ml offer on spoon for more pace bottle feeding using slow flow nipple.  ?3. Mom preference to use manual pump q 3hrs for 10 min each breast.  ?4. I and O sheet reviewed.  ?All questions answered at the end of the visit.  ?Mom feeding preferences for now is to EBF. If after pumping, not getting enough she will request formula.  ?LC reviewed feeding plan and finding above with RN, Caryl Asp.  ?Maternal Data ?Has patient been taught Hand Expression?: Yes ?Does the patient have breastfeeding experience prior to this delivery?: Yes ?How long did the patient breastfeed?: one child for 15 months and other for 6 months ? ?Feeding ?Mother's Current Feeding Choice: Breast Milk and Formula ? ?LATCH Score ?Latch: Repeated attempts needed to sustain latch, nipple held in mouth throughout feeding, stimulation needed to elicit sucking reflex. ? ?Audible Swallowing: A few with stimulation ? ?Type of Nipple: Everted at rest and after stimulation ? ?Comfort (Breast/Nipple): Soft / non-tender ? ?Hold (Positioning): Assistance needed to correctly position infant at breast and maintain latch. ? ?LATCH Score: 7 ? ? ?Lactation Tools Discussed/Used ?Tools: Pump;Flanges ?Flange Size: 24 ?Breast pump type: Manual (Mom  preference to use manual pump to offer more volume after latching.) ?Pump Education: Setup, frequency, and cleaning;Milk Storage ?Reason for Pumping: increase stimulation ?Pumping frequency: every 3 hrs for 10 min each breast. ? ?Interventions ?Interventions: Breast feeding basics reviewed;Assisted with latch;Skin to skin;Breast massage;Hand express;Breast compression;Adjust position;Support pillows;Position options;Expressed milk;Hand pump;Education;Pace feeding;LC Psychologist, educational;Infant Driven Feeding Algorithm education ? ?Discharge ?Pump: Manual ?WIC Program: No (Mom given the number to start certification process. Her preference to get formula from [facility]. She does not have electric pump at home, preference to use a manual.) ? ?Consult Status ?Consult Status: Follow-up ?Date: 07/08/21 ?Follow-up type: In-patient ? ? ? ?Genesia Caslin  Nicholson-Springer ?07/07/2021, 2:07 PM ? ? ? ?

## 2021-07-07 NOTE — Anesthesia Postprocedure Evaluation (Signed)
Anesthesia Post Note ? ?Patient: Bridget Bray ? ?Procedure(s) Performed: Repeat CESAREAN SECTION ? ?  ? ?Patient location during evaluation: PACU ?Anesthesia Type: Spinal ?Level of consciousness: oriented and awake and alert ?Pain management: pain level controlled ?Vital Signs Assessment: post-procedure vital signs reviewed and stable ?Respiratory status: spontaneous breathing and respiratory function stable ?Cardiovascular status: blood pressure returned to baseline and stable ?Postop Assessment: no headache, no backache, no apparent nausea or vomiting and able to ambulate ?Anesthetic complications: no ? ? ?No notable events documented. ? ?Last Vitals:  ?Vitals:  ? 07/07/21 1105 07/07/21 1119  ?BP:  (!) 85/60  ?Pulse: 70 63  ?Resp: 16 16  ?Temp:  36.7 ?C  ?SpO2: 100% 100%  ?  ?Last Pain:  ?Vitals:  ? 07/07/21 1119  ?TempSrc: Oral  ?PainSc:   ? ?Pain Goal:   ? ?  ?  ?  ?  ?  ?  ?  ? ?Mellody Dance ? ? ? ? ?

## 2021-07-07 NOTE — Anesthesia Procedure Notes (Signed)
Spinal ? ?Patient location during procedure: OR ?Reason for block: surgical anesthesia ?Staffing ?Performed: anesthesiologist  ?Anesthesiologist: Mellody Dance, MD ?Preanesthetic Checklist ?Completed: patient identified, IV checked, risks and benefits discussed, surgical consent, monitors and equipment checked, pre-op evaluation and timeout performed ?Spinal Block ?Patient position: sitting ?Prep: DuraPrep ?Patient monitoring: cardiac monitor, continuous pulse ox and blood pressure ?Approach: midline ?Location: L3-4 ?Injection technique: single-shot ?Needle ?Needle type: Pencan  ?Needle gauge: 24 G ?Needle length: 9 cm ?Assessment ?Events: CSF return ?Additional Notes ?Functioning IV was confirmed and monitors were applied. Sterile prep and drape, including hand hygiene and sterile gloves were used. The patient was positioned and the spine was prepped. The skin was anesthetized with lidocaine.  Free flow of clear CSF was obtained prior to injecting local anesthetic into the CSF.  The spinal needle aspirated freely following injection.  The needle was carefully withdrawn.  The patient tolerated the procedure well.  SRNA Taylor placed, while directly supervised by me. Tanna Furry, MD  ? ? ? ? ?

## 2021-07-07 NOTE — Op Note (Signed)
Cesarean Section Procedure Note  ? ?Bridget Bray ? ?07/07/2021 ? ?Indications:  srom, active labor, h/o c/s x2   ? ?Pre-operative Diagnosis: Previous Cesarean Section x2, labor, srom, preterm ? ?Post-operative Diagnosis: Same, dense fascial scarring, thin LUS ? ?Surgeon: Surgeon(s) and Role: ?   Amado Nash Candace Gallus, MD - Primary  ? ?Assistants: Dorisann Frames, CNM ? ?Anesthesia: spinal  ? ?Procedure Details:  ?The patient was seen in the Holding Room. The risks, benefits, complications, treatment options, and expected outcomes were discussed with the patient. The patient concurred with the proposed plan, giving informed consent. identified as Bridget Bray and the procedure verified as C-Section Delivery. A Time Out was held and the above information confirmed.  ?After induction of anesthesia, the patient was draped and prepped in the usual sterile manner, foley was draining urine well.  A pfannenstiel incision was made and carried down through the subcutaneous tissue to the fascia. Fascial incision was made and extended transversely. The fascia was separated from the underlying rectus tissue superiorly and inferiorly. The peritoneum was identified and entered. Peritoneal incision was extended longitudinally. Alexis-O retractor placed. The utero-vesical peritoneal reflection was unable to be made d/t very thin LUS.  The reflection was not clearly identifiable and bladder noted below LUS. Fetal face/head palpated through LUS. Scalpel was used to make initial incision in LUS very carefully to just make opening and then bandage scissors were used to extend incision sine so thin and face/head just under incision.  The incision was then stretched manually.  Delivered from cephalic presentation was a viable female infant with vigorous cry. Apgar scores of 8 at one minute and 8 at five minutes. Delayed cord clamping done at 1 minute and baby handed to NICU team in attendance. Cord ph was not sent. Cord blood was  obtained for evaluation. The placenta was removed Intact, spontaneously and appeared normal. The uterine outline, tubes and ovaries appeared normal}. The uterine incision was closed with running locked sutures of 0Vicryl. A second imbricating layer sutured.   Hemostasis was observed. Alexis retractor removed. Peritoneal closure done with 2-0 Vicryl.  The fascia was then reapproximated with running sutures of 0Vicryl. The subcuticular closure was performed using 2-0plain gut. The skin was closed with 4-0Vicryl.  ? ?Instrument, sponge, and needle counts were correct prior the abdominal closure and were correct at the conclusion of the case.  ? ? ?Findings: viable female infant, apgars 8/8; uterus with very thin LUS, otherwise wnl; bilat tubes/ov wnl ?  ?Estimated Blood Loss: 391  ? ?Total IV Fluids:  ? ?Urine Output:  200 CC OF clear urine ? ?Specimens: none  ? ?Complications: no complications ? ?Disposition: PACU - hemodynamically stable.  ? ?Maternal Condition: stable  ? ?Baby condition / location:  Couplet care / Skin to Skin ? ?Attending Attestation: I performed the procedure.  ? ?Signed: ?Surgeon(s): ?Breonna Gafford, Candace Gallus, MD ? ? ? ?  ? ?

## 2021-07-07 NOTE — Transfer of Care (Signed)
Immediate Anesthesia Transfer of Care Note ? ?Patient: Bridget Bray ? ?Procedure(s) Performed: Repeat CESAREAN SECTION ? ?Patient Location: PACU ? ?Anesthesia Type:Spinal ? ?Level of Consciousness: awake, alert  and oriented ? ?Airway & Oxygen Therapy: Patient Spontanous Breathing ? ?Post-op Assessment: Report given to RN and Post -op Vital signs reviewed and stable ? ?Post vital signs: Reviewed and stable ? ?Last Vitals:  ?Vitals Value Taken Time  ?BP 89/53 07/07/21 0935  ?Temp    ?Pulse 71 07/07/21 0937  ?Resp 18 07/07/21 0937  ?SpO2 96 % 07/07/21 0937  ?Vitals shown include unvalidated device data. ? ?Last Pain:  ?Vitals:  ? 07/07/21 0521  ?TempSrc: Oral  ?PainSc: 4   ?   ? ?  ? ?Complications: No notable events documented. ?

## 2021-07-07 NOTE — H&P (Signed)
Bridget Bray is a 26 y.o. [id number] at [redacted]w[redacted]d gestation presents for complaint of  leaking clear fluid at 0345 this am, also with painful contractions.  +FM, no vb. ? ?Antepartum course:  h/o c/s x2  , anemia - iron q day; isolated eif ?PNCare at [facility] since 10 wks. ? ?See complete pre-natal records ? ?History ?OB History   ? ? Gravida  ?4  ? Para  ?2  ? Term  ?2  ? Preterm  ?   ? AB  ?1  ? Living  ?2  ?  ? ? SAB  ?1  ? IAB  ?   ? Ectopic  ?   ? Multiple  ?0  ? Live Births  ?2  ?   ?  ?  ? ?Past Medical History:  ?Diagnosis Date  ? Anemia   ? Medical history non-contributory   ? ?Past Surgical History:  ?Procedure Laterality Date  ? CESAREAN SECTION N/A 02/14/2018  ? Procedure: CESAREAN SECTION;  Surgeon: Tereso Newcomer, MD;  Location: [facility];  Service: Obstetrics;  Laterality: N/A;  ? CESAREAN SECTION N/A 07/21/2020  ? Procedure: CESAREAN SECTION;  Surgeon: Hoover Browns, MD;  Location: [facility] LD ORS;  Service: Obstetrics;  Laterality: N/A;  ? TENDON REPAIR Right 09/05/2012  ? Procedure: NERVE AND TENDON REPAIR;  Surgeon: Sharma Covert, MD;  Location: [facility] OR;  Service: Orthopedics;  Laterality: Right;  ? WOUND EXPLORATION Right 09/05/2012  ? Procedure: WOUND EXPLORATION OF RIGHT WRIST;  Surgeon: Sharma Covert, MD;  Location: [facility] OR;  Service: Orthopedics;  Laterality: Right;  ? ?Family History: family history includes Healthy in her father and mother. ?Social History:  reports that she has never smoked. She has never used smokeless tobacco. She reports that she does not drink alcohol and does not use drugs. ? ?ROS: See above otherwise negative ? ?Prenatal labs: ? ?ABO, Rh: --/--/PENDING (03/08 6073) ?Antibody: PENDING (03/08 0615) ?Rubella:  immune ?RPR: NON REACTIVE (03/22 0930)  ?HBsAg:   neg ?HIV:NON REACTIVE (03/22 0930)  ?GBS:   neg ?1 hr Glucola: Normal ?Genetic screening: Normal ?Anatomy US: normal, eif noted ? ?Physical Exam:  ? ?Dilation: Fingertip ?Exam by:: Georgina Snell, RN ?Blood  pressure 113/83, pulse 91, temperature (!) 97.5 ?F (36.4 ?C), temperature source Oral, resp. rate 20, height 4\' 11"  (1.499 m), weight 73.1 kg, unknown if currently breastfeeding. ?A&O x 3 ?HEENT: Normal ?Lungs: CTAB ?CV: RRR ?Abdominal: Soft, Non-tender, and Gravid ?Lower Extremities: Non-edematous, Non-tender ? ?Pelvic Exam: ? ?    Deferred, FT/thick per nurse ? ?Labs: ? ?CBC:  ?Lab Results  ?Component Value Date  ? WBC 8.2 07/07/2021  ? RBC 4.05 07/07/2021  ? HGB 12.3 07/07/2021  ? HCT 36.2 07/07/2021  ? MCV 89.4 07/07/2021  ? MCH 30.4 07/07/2021  ? MCHC 34.0 07/07/2021  ? RDW 14.4 07/07/2021  ? PLT 201 07/07/2021  ? ? ?Urine: ?Lab Results  ?Component Value Date  ? COLORURINE STRAW (A) 05/06/2019  ? APPEARANCEUR HAZY (A) 05/06/2019  ? LABSPEC 1.011 05/06/2019  ? PHURINE 5.0 05/06/2019  ? GLUCOSEU NEGATIVE 05/06/2019  ? HGBUR LARGE (A) 05/06/2019  ? BILIRUBINUR NEGATIVE 05/06/2019  ? KETONESUR NEGATIVE 05/06/2019  ? PROTEINUR NEGATIVE 05/06/2019  ? NITRITE NEGATIVE 05/06/2019  ? LEUKOCYTESUR NEGATIVE 05/06/2019  ? ? ? ?Prenatal Transfer Tool  ?Maternal Diabetes: No ?Genetic Screening: Normal ?Maternal Ultrasounds/Referrals: Isolated EIF (echogenic intracardiac focus) ?Fetal Ultrasounds or other Referrals:  None ?Maternal Substance Abuse:  No ?Significant Maternal Medications:  None ?Significant Maternal Lab Results: Group B Strep negative ? ?FHT: 140s, nml variablity, +accles, couple variables, no decels ?TOCO: 2-7 min ? ?Assessment/Plan:  26 y.o. [id number] at [redacted]w[redacted]d gestation  ? ?SROM - pt also in early labor; h/o c/s x2, plan rltcs; I have seen patient and reviewed indications and plan for c/s.  Risks reviewed including but not limited to bleeding (possible transfusion), infection, risk of injury to bowel/bladder/nerves/blood vessels, risk of further surgery, risk of anesthesia, risk of blood clot to leg/lung.  She agrees to proceed to OR.  Consent signed ?Anemia of pregnancy - f/u pp ?Rh pos ?RI ?GBS neg  ? ?[name] ?07/07/2021, 7:06 AM ? ?

## 2021-07-07 NOTE — Lactation Note (Signed)
This note was copied from a baby's chart. ?Lactation Consultation Note ? ?Patient Name: Bridget Bray ?Today's Date: 07/07/2021 ?  ?Age:28 hours ?LC talked with RN, Caryl Asp, if this was a good time to work with Mom to breastfeed. RN states mother is experiencing nausea and not a good time. Mom put infant to breast twice and they are monitoring glucose. If low, Mom agreed to supplement with formula. ? ?RN to alert LC when time for next feeding to check the latch and set up DEBP.  ? ?Maternal Data ?  ? ?Feeding ?  ? ?LATCH Score ?Latch: Grasps breast easily, tongue down, lips flanged, rhythmical sucking. ? ?Audible Swallowing: A few with stimulation ? ?Type of Nipple: Everted at rest and after stimulation ? ?Comfort (Breast/Nipple): Soft / non-tender ? ?Hold (Positioning): Assistance needed to correctly position infant at breast and maintain latch. ? ?LATCH Score: 8 ? ? ?Lactation Tools Discussed/Used ?  ? ?Interventions ?  ? ?Discharge ?  ? ?Consult Status ?  ? ? ? ?Heide Brossart  Nicholson-Springer ?07/07/2021, 12:15 PM ? ? ? ?

## 2021-07-07 NOTE — MAU Note (Addendum)
PT SAYS SROM AT 0348 - CLEAR ?UC'S STRONG SINCE 0420 ?[facility] C/S ON 3-28 ?GBS- NEG ?PNC WITH DR Ernestina Penna - SAW YESTERDAY  ?

## 2021-07-07 NOTE — Anesthesia Preprocedure Evaluation (Addendum)
Anesthesia Evaluation  ? ?Patient awake ? ? ? ?Reviewed: ?Allergy & Precautions, NPO status , Patient's Chart, lab work & pertinent test results ? ?Airway ?Mallampati: II ? ?TM Distance: >3 FB ?Neck ROM: Full ? ? ? Dental ?no notable dental hx. ? ?  ?Pulmonary ?neg pulmonary ROS,  ?  ?Pulmonary exam normal ?breath sounds clear to auscultation ? ? ? ? ? ? Cardiovascular ?negative cardio ROS ?Normal cardiovascular exam ?Rhythm:Regular Rate:Normal ? ? ?  ?Neuro/Psych ?negative neurological ROS ? negative psych ROS  ? GI/Hepatic ?negative GI ROS, Neg liver ROS,   ?Endo/Other  ?negative endocrine ROS ? Renal/GU ?negative Renal ROS  ?negative genitourinary ?  ?Musculoskeletal ?negative musculoskeletal ROS ?(+)  ? Abdominal ?  ?Peds ?negative pediatric ROS ?(+)  Hematology ? ?(+) Blood dyscrasia, anemia ,   ?Anesthesia Other Findings ? ? Reproductive/Obstetrics ?(+) Pregnancy ? ?  ? ? ? ? ? ? ? ? ? ? ? ? ? ?  ?  ? ? ? ? ? ? ? ?Anesthesia Physical ?Anesthesia Plan ? ?ASA: 2 and emergent ? ?Anesthesia Plan: Spinal  ? ?Post-op Pain Management: Minimal or no pain anticipated  ? ?Induction: Intravenous ? ?PONV Risk Score and Plan: 2 and Treatment may vary due to age or medical condition ? ?Airway Management Planned: Natural Airway and Simple Face Mask ? ?Additional Equipment: None ? ?Intra-op Plan:  ? ?Post-operative Plan:  ? ?Informed Consent: I have reviewed the patients History and Physical, chart, labs and discussed the procedure including the risks, benefits and alternatives for the proposed anesthesia with the patient or authorized representative who has indicated his/her understanding and acceptance.  ? ? ? ?Dental advisory given ? ?Plan Discussed with: CRNA and Anesthesiologist ? ?Anesthesia Plan Comments:   ? ? ? ? ? ? ?Anesthesia Quick Evaluation ? ?

## 2021-07-07 NOTE — Progress Notes (Signed)
Pt complains of dizziness while standing during orthostatic vital signs. Pt orthostatics 92/54 lying,  85/55 sitting, and 85/58 standing. Pt heart rate remained between 60-70's. Dorisann Frames CNM notified. New orders given for LR 500cc bolus.  ?

## 2021-07-08 ENCOUNTER — Encounter (HOSPITAL_COMMUNITY): Payer: Self-pay | Admitting: Obstetrics and Gynecology

## 2021-07-08 LAB — CBC
HCT: 28.9 % — ABNORMAL LOW (ref 36.0–46.0)
Hemoglobin: 9.6 g/dL — ABNORMAL LOW (ref 12.0–15.0)
MCH: 30.1 pg (ref 26.0–34.0)
MCHC: 33.2 g/dL (ref 30.0–36.0)
MCV: 90.6 fL (ref 80.0–100.0)
Platelets: 167 10*3/uL (ref 150–400)
RBC: 3.19 MIL/uL — ABNORMAL LOW (ref 3.87–5.11)
RDW: 14.6 % (ref 11.5–15.5)
WBC: 10.8 10*3/uL — ABNORMAL HIGH (ref 4.0–10.5)
nRBC: 0 % (ref 0.0–0.2)

## 2021-07-08 NOTE — Addendum Note (Signed)
Addendum  created 07/08/21 1038 by Mellody Dance, MD  ? Intraprocedure Event deleted  ?  ?

## 2021-07-08 NOTE — Progress Notes (Addendum)
Spoke with Gavin Potters CNM regarding pt recent BP and blood work. CNM is aware of both. Pt states she feels better and less dizzy after eating, although BP remains on the lower end. Output from foley is good and clear. Pt up to walk with assistance by RN and foley removed per approval by CNM. Pt is stable at this time.  ?

## 2021-07-08 NOTE — Progress Notes (Signed)
POD# 1 ? ?S: Pt notes pain controlled well yesterday though starting to build today. Recc to take po med as spinal wearing off. Pt notes significant dizziness last night, improved with IV bolus. Pt notes dizziness and hypotension happenned with her last c/s. Pt notes out of bed w/o dizziness today. No CP, no SOB. Normal void. Minimal lochia. Pt notes tolerating regular po though no flatus yet.  ? ?Pt is  breastfeeding and supplementing ? ?Vitals:  ? 07/07/21 1934 07/07/21 2130 07/07/21 2353 07/08/21 0406  ?BP: (!) 83/46 (!) 77/54 (!) 82/53 (!) 81/52  ?Pulse: 69 66 66 67  ?Resp: 18  18 18   ?Temp: 98.2 ?F (36.8 ?C)  98.3 ?F (36.8 ?C) 98.4 ?F (36.9 ?C)  ?TempSrc: Oral  Oral Oral  ?SpO2: 97%  96% 97%  ?Weight:      ?Height:      ? ? ?Gen: well appearing ?CV: RRR ?Pulm: CTAB ?Abd: soft, ND, approp tender, fundus below umbilicus, NT ?Inc: intact, dressing wet and bloody ?LE: tr edema, NT ? ?CBC ?   ?Component Value Date/Time  ? WBC 10.8 (H) 07/08/2021 0519  ? RBC 3.19 (L) 07/08/2021 0519  ? HGB 9.6 (L) 07/08/2021 0519  ? HGB 11.5 11/28/2017 1045  ? HCT 28.9 (L) 07/08/2021 0519  ? HCT 34.5 11/28/2017 1045  ? PLT 167 07/08/2021 0519  ? PLT 228 11/28/2017 1045  ? MCV 90.6 07/08/2021 0519  ? MCV 93 11/28/2017 1045  ? MCV 84 08/27/2013 1724  ? MCH 30.1 07/08/2021 0519  ? MCHC 33.2 07/08/2021 0519  ? RDW 14.6 07/08/2021 0519  ? RDW 12.6 11/28/2017 1045  ? RDW 13.5 08/27/2013 1724  ? LYMPHSABS 2.7 07/25/2017 1149  ? EOSABS 0.1 07/25/2017 1149  ? BASOSABS 0.0 07/25/2017 1149  ? ?CBC Latest Ref Rng & Units 07/08/2021 07/07/2021 07/07/2021  ?WBC 4.0 - 10.5 K/uL 10.8(H) 13.2(H) 8.2  ?Hemoglobin 12.0 - 15.0 g/dL 09/06/2021) 8.1(K) 4.8(J  ?Hematocrit 36.0 - 46.0 % 28.9(L) 29.0(L) 36.2  ?Platelets 150 - 400 K/uL 167 187 201  ? ? ?A/P: POD#  1 s/p RCS after SROM ?- post-op. Doing well.  ?- change dressing ?-mild anemia, asymptomatic, will start iron at d/c ?- dizziness yesterday with hypotensions, Always with good Uout, never with tachycardia,  stable serial CBC. No evidence surgical bleeding. Suspect dizziness/hypotension due to low baseline bps (low bps through preg) and anesthesia meds. Pt doing better today without dizziness.  ? ?85.6 ?07/08/2021 12:37 PM   ?

## 2021-07-08 NOTE — Lactation Note (Addendum)
This note was copied from a baby's chart. ?Lactation Consultation Note ? ?Patient Name: Bridget Bray ?Today's Date: 07/08/2021 ?Reason for consult: Follow-up assessment;Late-preterm 34-36.6wks;Infant weight loss;Breastfeeding assistance ?Age:29 hours ? ?LC reviewed LPTI guidelines on the board and adjusted supplementing volume to 10-20 ml per feeding after latch. Mom aware if infant not latched she can offer more.  ? ?Mom using hand pump for stimulation post pumping after latching q 3hrs for 10 min each breast.  ?Mom encouraged to offer EBM first before formula with volumes states above.  ?Mom states increased her flange size to 27, states better fit.  ?Coconut oil provided for nipple care to use just before pumping.  ? ?Infant adequate urine and stool output. Mom asked to be placed prn and will call for [facility] assistance if needed.  ?All questions answered at the end of the visit.  ? ?Maternal Data ?  ? ?Feeding ?Mother's Current Feeding Choice: Breast Milk and Formula ?Nipple Type: Slow - flow ? ?LATCH Score ?  ? ?  ? ?  ? ?  ? ?  ? ?  ? ? ?Lactation Tools Discussed/Used ?Tools: Pump;Flanges ?Flange Size: 27 ?Breast pump type: Manual ?Pump Education: Setup, frequency, and cleaning;Milk Storage ?Reason for Pumping: increaes stimulation ?Pumping frequency: every 3 hrs for 10 min each breast. ? ?Interventions ?Interventions: Breast feeding basics reviewed;Coconut oil;Expressed milk;Hand pump;Education;Pace feeding;Infant Driven Feeding Algorithm education ? ?Discharge ?  ? ?Consult Status ?Consult Status: PRN ?Date: 07/09/21 ?Follow-up type: In-patient ? ? ? ?Antuane Eastridge  Nicholson-Springer ?07/08/2021, 6:08 PM ? ? ? ?

## 2021-07-09 NOTE — Progress Notes (Signed)
POD# 2 RC/s <37 wks for SROM  ? ?S: doing well, ambulating and voiding, tolerating diet, pain controlled. Cannot get discharge as baby needs to stay for wt loss. Pt is  breastfeeding and supplementing ?Denies dizziness/ cp/ sob with low BPs  ? ?Vitals:  ? 07/08/21 0406 07/08/21 1409 07/08/21 2003 07/09/21 0627  ?BP: (!) 81/52 (!) 86/54 (!) 86/54 91/65  ?Pulse: 67 71 74 75  ?Resp: 18 16 18 18   ?Temp: 98.4 ?F (36.9 ?C) 98.5 ?F (36.9 ?C) 98.2 ?F (36.8 ?C) 98 ?F (36.7 ?C)  ?TempSrc: Oral Oral Oral Oral  ?SpO2: 97% 99% 97% 98%  ?Weight:      ?Height:      ? ? ?Gen: well appearing ?CV: RRR ?Pulm: CTAB ?Abd: soft, ND, approp tender, fundus below umbilicus, NT ?Inc: intact, dressing wet and bloody ?LE: tr edema, NT ? ? ?CBC Latest Ref Rng & Units 07/08/2021 07/07/2021 07/07/2021  ?WBC 4.0 - 10.5 K/uL 10.8(H) 13.2(H) 8.2  ?Hemoglobin 12.0 - 15.0 g/dL 9.6(L) 9.9(L) 12.3  ?Hematocrit 36.0 - 46.0 % 28.9(L) 29.0(L) 36.2  ?Platelets 150 - 400 K/uL 167 187 201  ? ? ?A/P: POD#  2 s/p RCS after SROM at 36+ wks. Baby needs to stay so defer maternal dc to tomorrow  ?- post-op. Doing well.  ?-mild anemia, asymptomatic, needs iron at d/c ?-Asymptomatic hypotension and no concerns for acute post-op complications.  ?Anticipate DC tomorrow  ? ?Duane Lake ?07/09/2021 12:13 PM   ?

## 2021-07-10 MED ORDER — IBUPROFEN 600 MG PO TABS: 600.0000 mg | ORAL_TABLET | Freq: Four times a day (QID) | ORAL | 0 refills | Status: AC

## 2021-07-10 MED ORDER — OXYCODONE HCL 5 MG PO TABS: 5.0000 mg | ORAL_TABLET | Freq: Four times a day (QID) | ORAL | 0 refills | Status: AC | PRN

## 2021-07-10 NOTE — Discharge Summary (Signed)
? ?  Postpartum Discharge Summary ? ?Date of Service updated 07/10/21 ? ?   ?Patient Name: Bridget Bray ?DOB: [birth year] ?MRN: [id number] ? ?Date of admission: 07/07/2021 ?Delivery date:07/07/2021  ?Delivering provider: Tiana Loft E  ?Date of discharge: 07/10/2021 ? ?Admitting diagnosis: Indication for care in labor and delivery, antepartum [O75.9] ?Cesarean delivery delivered [O82] ?Intrauterine pregnancy: [redacted]w[redacted]d    ?Secondary diagnosis:  Principal Problem: ?  Postpartum care following cesarean delivery 3/8 ?Active Problems: ?  Preterm labor with SROM ?  Anemia, iron deficiency ?  Status post repeat low transverse cesarean section 3/8 ?  Cesarean delivery delivered ? ?Additional problems: none    ?Discharge diagnosis: Preterm Pregnancy Delivered                                              ?Post partum procedures: N/A ?Augmentation: N/A ?Complications: None ? ?Hospital course: Onset of Labor With Unplanned C/S   ?26y.o. yo [id number] admitted in Latent Labor on 07/07/2021. Patient had a labor course significant for PPROM at 36.2. The patient went for cesarean section due to Elective Repeat. Delivery details as follows: ?Membrane Rupture Time/Date: 8:35 AM ,07/07/2021   ?Delivery Method:C-Section, Low Transverse  ?Details of operation can be found in separate operative note. Patient had an uncomplicated postpartum course.  She is ambulating,tolerating a regular diet, passing flatus, and urinating well.  Patient is discharged home in stable condition 07/10/21. ? ?Newborn Data: ?Birth date:07/07/2021  ?Birth time:8:36 AM  ?Gender:Female  ?Living status:Living  ?Apgars:8 ,8  ?Weight:3140 g  ? ?Magnesium Sulfate received: No ?BMZ received: No ?Rhophylac:N/A ?MMR:N/A ?T-DaP:Given prenatally ? ?Physical exam  ?Vitals:  ? 07/09/21 0627 07/09/21 1404 07/09/21 2025 07/10/21 0627  ?BP: 91/65 94/66 108/83 102/75  ?Pulse: 75 82 69 78  ?Resp: 18 18 16 18   ?Temp: 98 ?F (36.7 ?C) 98 ?F (36.7 ?C) (!) 97.4 ?F (36.3 ?C)  98.8 ?F (37.1 ?C)  ?TempSrc: Oral Oral Oral Oral  ?SpO2: 98% 98% 99%   ?Weight:      ?Height:      ? ?General: alert and no distress ?Lochia: appropriate ?Uterine Fundus: firm ?Incision: Dressing is clean, dry, and intact ?DVT Evaluation: No evidence of DVT seen on physical exam. ?Labs: ?Lab Results  ?Component Value Date  ? WBC 10.8 (H) 07/08/2021  ? HGB 9.6 (L) 07/08/2021  ? HCT 28.9 (L) 07/08/2021  ? MCV 90.6 07/08/2021  ? PLT 167 07/08/2021  ? ?CMP Latest Ref Rng & Units 07/07/2021  ?Glucose 70 - 99 mg/dL 119(H)  ?BUN 6 - 20 mg/dL <5(L)  ?Creatinine 0.44 - 1.00 mg/dL 0.58  ?Sodium 135 - 145 mmol/L 134(L)  ?Potassium 3.5 - 5.1 mmol/L 4.1  ?Chloride 98 - 111 mmol/L 105  ?CO2 22 - 32 mmol/L 22  ?Calcium 8.9 - 10.3 mg/dL 8.3(L)  ?Total Protein 6.5 - 8.1 g/dL 4.6(L)  ?Total Bilirubin 0.3 - 1.2 mg/dL 0.3  ?Alkaline Phos 38 - 126 U/L 78  ?AST 15 - 41 U/L 15  ?ALT 0 - 44 U/L 8  ? ?Edinburgh Score: ?Edinburgh Postnatal Depression Scale Screening Tool 07/08/2021  ?I have been able to laugh and see the funny side of things. 0  ?I have looked forward with enjoyment to things. 0  ?I have blamed myself unnecessarily when things went wrong. 2  ?I have been anxious or worried  for no good reason. 0  ?I have felt scared or panicky for no good reason. 1  ?Things have been getting on top of me. 0  ?I have been so unhappy that I have had difficulty sleeping. 0  ?I have felt sad or miserable. 0  ?I have been so unhappy that I have been crying. 0  ?The thought of harming myself has occurred to me. 0  ?Edinburgh Postnatal Depression Scale Total 3  ? ? ? ? ?After visit meds:  ?Allergies as of 07/10/2021   ?No Known Allergies ?  ? ?  ?Medication List  ?  ? ?TAKE these medications   ? ?acetaminophen 500 MG tablet ?Commonly known as: TYLENOL ?Take 500 mg by mouth every 6 (six) hours as needed for mild pain or headache. ?  ?ferrous sulfate 325 (65 FE) MG tablet ?Take 325 mg by mouth daily with breakfast. ?  ?ibuprofen 600 MG tablet ?Commonly  known as: ADVIL ?Take 1 tablet (600 mg total) by mouth every 6 (six) hours. ?  ?oxyCODONE 5 MG immediate release tablet ?Commonly known as: Oxy IR/ROXICODONE ?Take 1 tablet (5 mg total) by mouth every 6 (six) hours as needed for severe pain. ?  ?prenatal multivitamin Tabs tablet ?Take 1 tablet by mouth daily at 12 noon. ?  ?simethicone 80 MG chewable tablet ?Commonly known as: MYLICON ?Chew 1 tablet (80 mg total) by mouth as needed for flatulence. ?  ? ?  ? ? ? ?Discharge home in stable condition ?Infant Feeding: Breast ?Infant Disposition:home with mother ?Discharge instruction: per After Visit Summary and Postpartum booklet. ?Activity: Advance as tolerated. Pelvic rest for 6 weeks.  ?Diet: routine diet ?Anticipated Birth Control: Unsure ?Postpartum Appointment:6 weeks ?Additional Postpartum F/U:  n/a ?Future Appointments:No future appointments. ?Follow up Visit: Patient instructed to call office to schedule 6 week routine postpartum visit with Dr. Pamala Hurry or sooner as needed ? ?  ? ?07/10/2021 ?Clemon Devaul A Gaberial Cada, DO ? ? ?

## 2021-07-17 ENCOUNTER — Telehealth (HOSPITAL_COMMUNITY): Payer: Self-pay

## 2021-07-17 NOTE — Telephone Encounter (Signed)
No answer. Left message to return nurse call. ? Heron Nay ?07/17/2021,1506 ?

## 2021-07-27 ENCOUNTER — Inpatient Hospital Stay (HOSPITAL_COMMUNITY): Admit: 2021-07-27 | Payer: Commercial Managed Care - PPO | Admitting: Obstetrics

## 2021-07-31 DIAGNOSIS — Z419 Encounter for procedure for purposes other than remedying health state, unspecified: Secondary | ICD-10-CM | POA: Diagnosis not present

## 2021-08-30 DIAGNOSIS — Z419 Encounter for procedure for purposes other than remedying health state, unspecified: Secondary | ICD-10-CM | POA: Diagnosis not present

## 2021-09-14 IMAGING — US US OB < 14 WEEKS - US OB TV
1 series · 15 of 28 positions shown · non-contrast
Comparison: None recent

CLINICAL DATA: Vaginal bleeding

EXAM:
OBSTETRIC <14 WK US AND TRANSVAGINAL OB US
TECHNIQUE: Both transabdominal and transvaginal ultrasound examinations were
performed for complete evaluation of the gestation as well as the
maternal uterus, adnexal regions, and pelvic cul-de-sac.
Transvaginal technique was performed to assess early pregnancy.

[Series 1: us ob < 14 weeks - us ob tv · 15 of 66 slices shown]
[im 1/66]
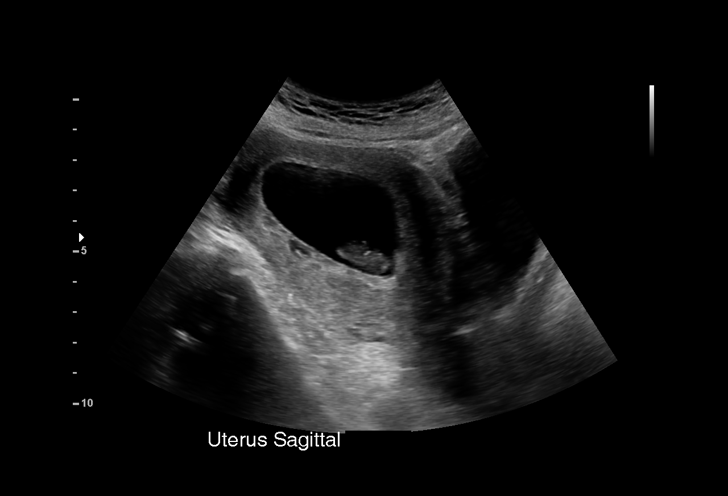
[im 5/66]
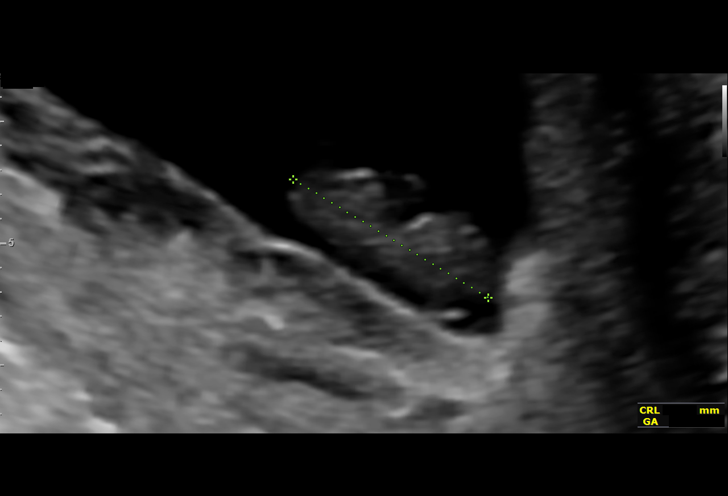
[im 10/66]
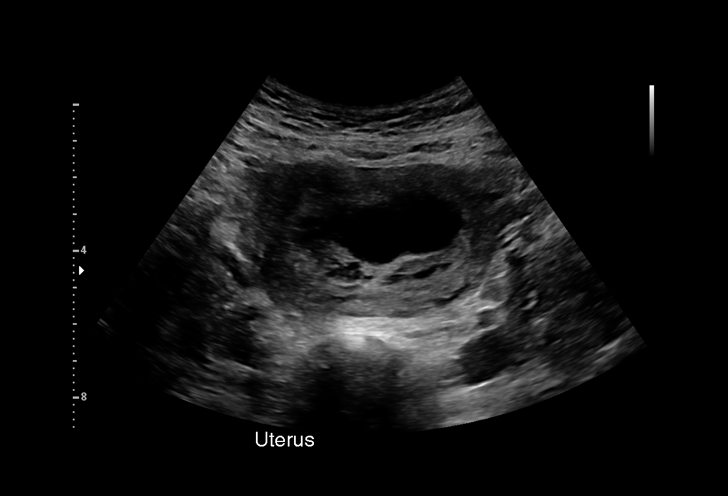
[im 15/66]
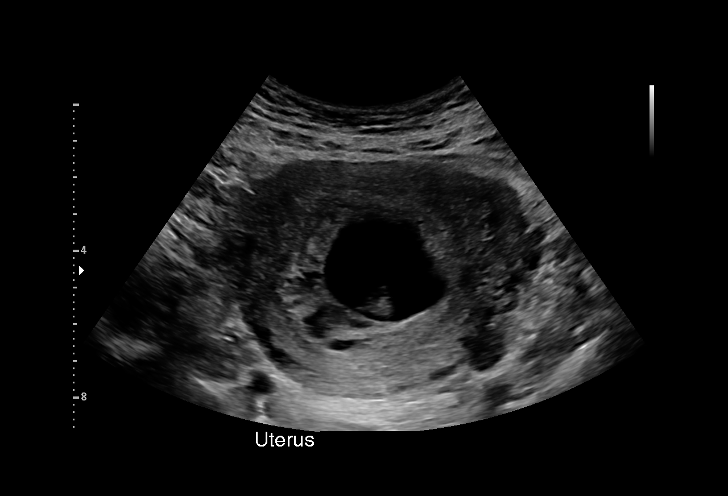
[im 20/66]
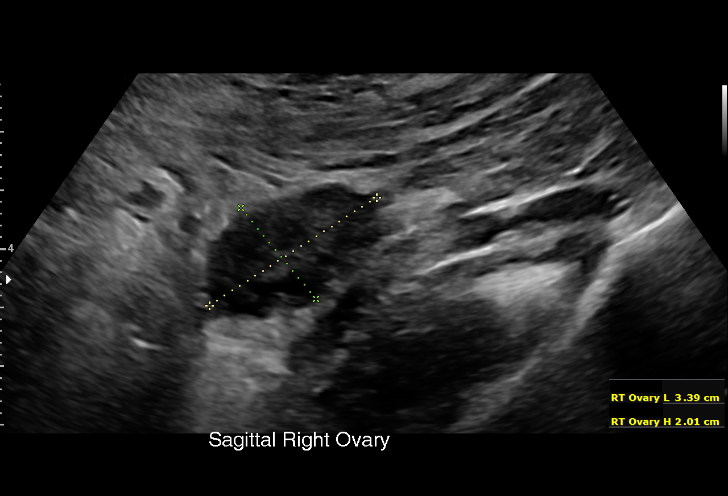
[im 25/66]
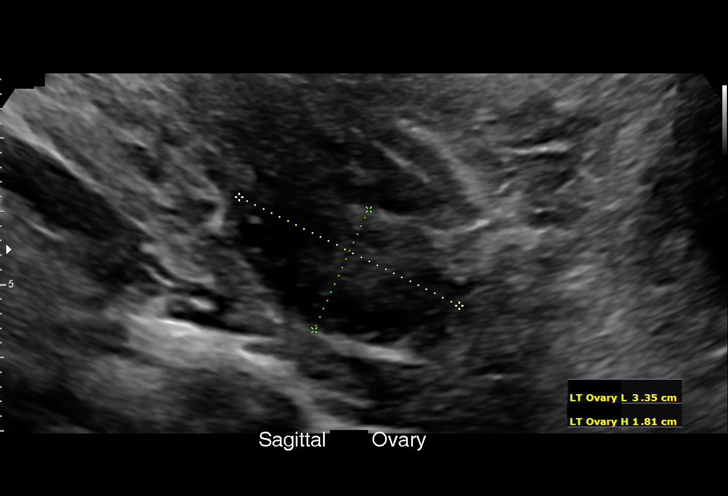
[im 29/66]
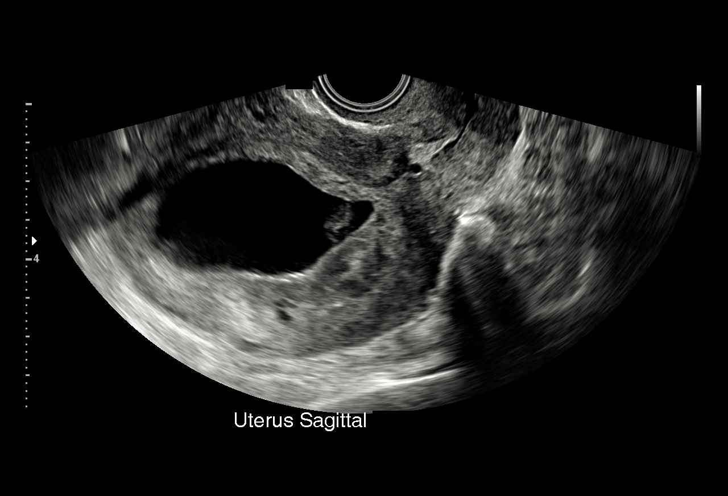
[im 34/66]
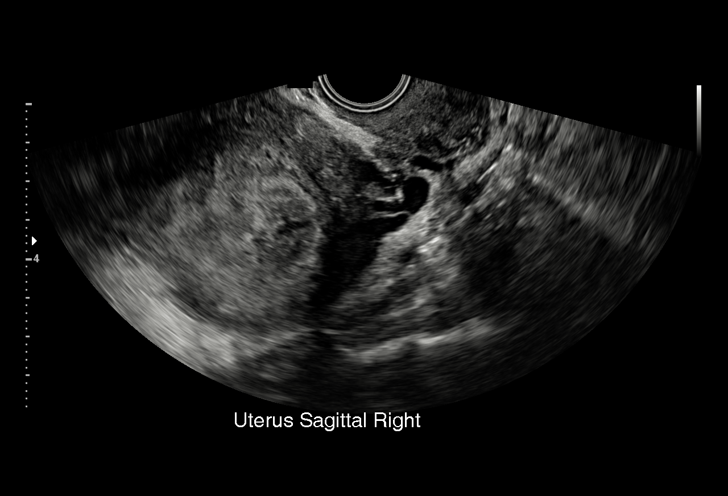
[im 37/66]
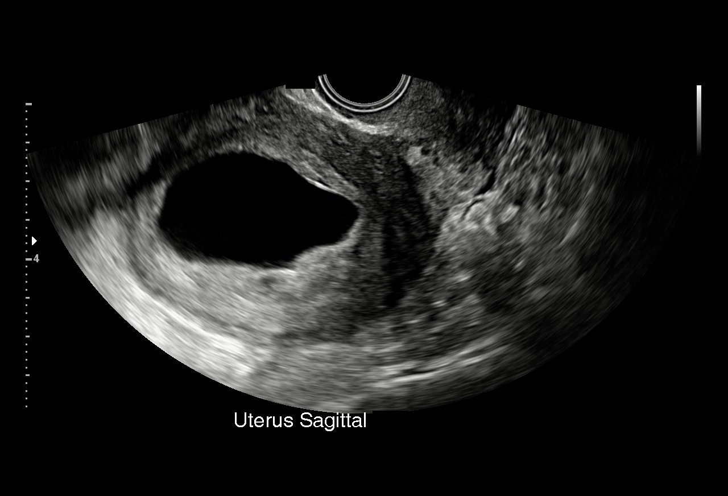
[im 41/66]
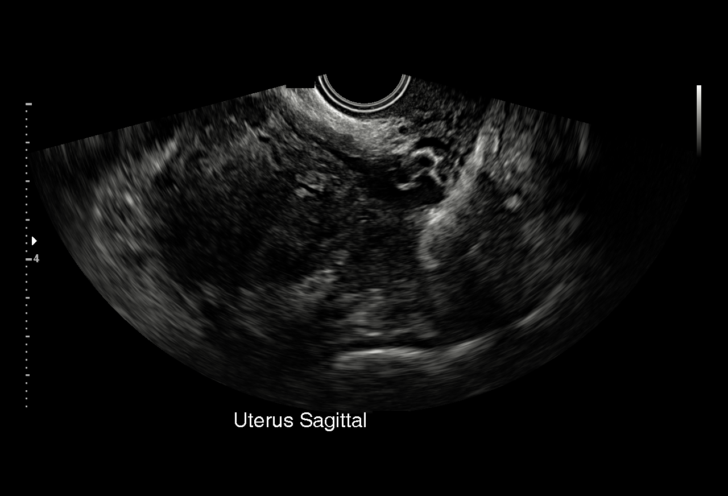
[im 46/66]
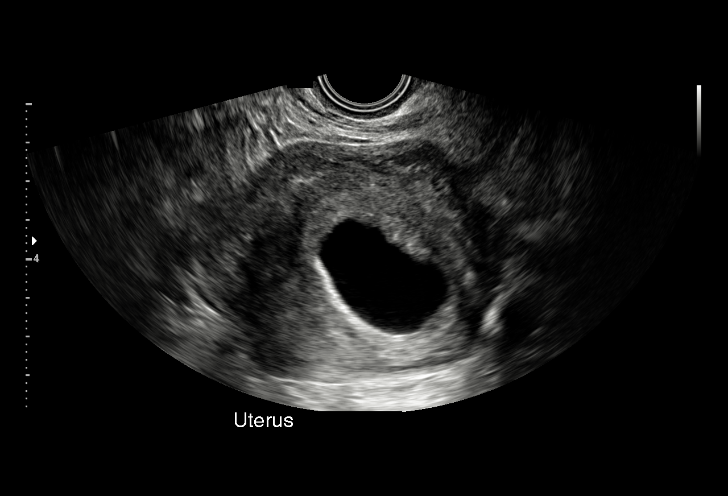
[im 51/66]
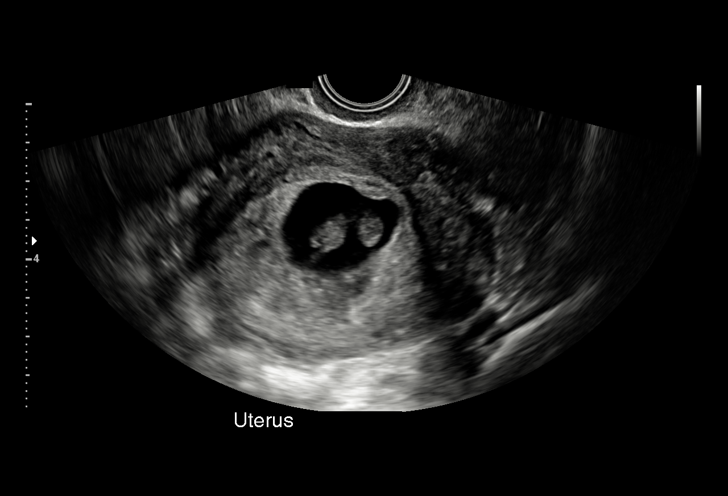
[im 56/66]
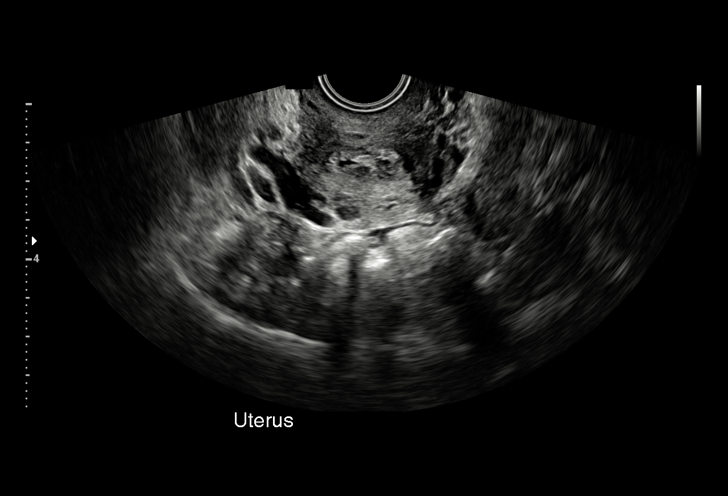
[im 61/66]
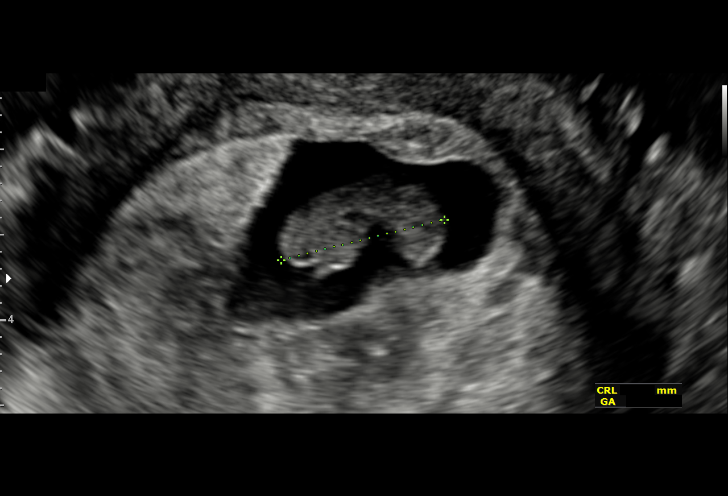
[im 66/66]
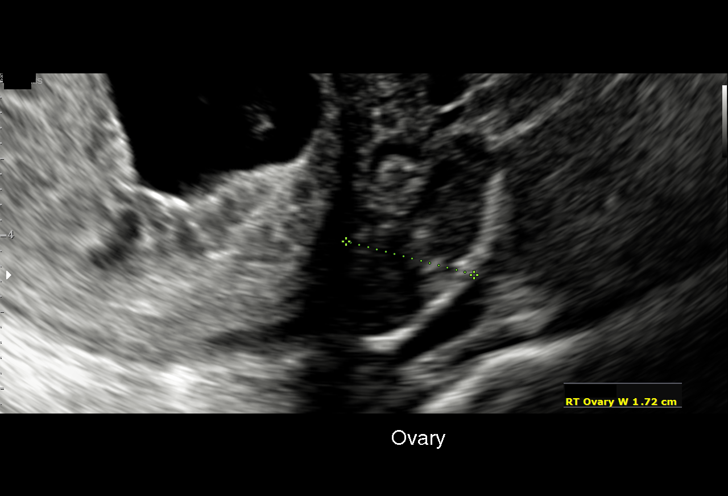

[15 of 28 positions shown; findings below may reference images not displayed]

FINDINGS: Intrauterine gestational sac: Single

Yolk sac:  Not Visualized.

Embryo:  Visualized.

Cardiac Activity: Not Visualized.

CRL: 19.5 mm   8 w   3 d                  US EDC: 12/13/2019

Subchorionic hemorrhage:  None visualized.

Maternal uterus/adnexae: The
IMPRESSION: 1. Single IUP at 8 weeks and 3 days without evidence for cardiac
activity. Findings meet definitive criteria for failed pregnancy.
This follows SRU consensus guidelines: Diagnostic Criteria for
Nonviable Pregnancy Early in the First Trimester. N Engl J Med
2. No maternal abnormality detected.

## 2021-09-30 DIAGNOSIS — Z419 Encounter for procedure for purposes other than remedying health state, unspecified: Secondary | ICD-10-CM | POA: Diagnosis not present

## 2021-10-30 DIAGNOSIS — Z419 Encounter for procedure for purposes other than remedying health state, unspecified: Secondary | ICD-10-CM | POA: Diagnosis not present

## 2021-11-30 DIAGNOSIS — Z419 Encounter for procedure for purposes other than remedying health state, unspecified: Secondary | ICD-10-CM | POA: Diagnosis not present

## 2021-12-31 DIAGNOSIS — Z419 Encounter for procedure for purposes other than remedying health state, unspecified: Secondary | ICD-10-CM | POA: Diagnosis not present

## 2022-01-30 DIAGNOSIS — Z419 Encounter for procedure for purposes other than remedying health state, unspecified: Secondary | ICD-10-CM | POA: Diagnosis not present

## 2022-03-02 DIAGNOSIS — Z419 Encounter for procedure for purposes other than remedying health state, unspecified: Secondary | ICD-10-CM | POA: Diagnosis not present

## 2022-04-01 DIAGNOSIS — Z419 Encounter for procedure for purposes other than remedying health state, unspecified: Secondary | ICD-10-CM | POA: Diagnosis not present

## 2022-05-02 DIAGNOSIS — Z419 Encounter for procedure for purposes other than remedying health state, unspecified: Secondary | ICD-10-CM | POA: Diagnosis not present

## 2022-06-02 DIAGNOSIS — Z419 Encounter for procedure for purposes other than remedying health state, unspecified: Secondary | ICD-10-CM | POA: Diagnosis not present

## 2022-07-01 DIAGNOSIS — Z419 Encounter for procedure for purposes other than remedying health state, unspecified: Secondary | ICD-10-CM | POA: Diagnosis not present

## 2022-08-01 DIAGNOSIS — Z419 Encounter for procedure for purposes other than remedying health state, unspecified: Secondary | ICD-10-CM | POA: Diagnosis not present

## 2022-08-31 DIAGNOSIS — Z419 Encounter for procedure for purposes other than remedying health state, unspecified: Secondary | ICD-10-CM | POA: Diagnosis not present

## 2022-10-01 DIAGNOSIS — Z419 Encounter for procedure for purposes other than remedying health state, unspecified: Secondary | ICD-10-CM | POA: Diagnosis not present

## 2022-10-31 DIAGNOSIS — Z419 Encounter for procedure for purposes other than remedying health state, unspecified: Secondary | ICD-10-CM | POA: Diagnosis not present

## 2022-12-01 DIAGNOSIS — Z419 Encounter for procedure for purposes other than remedying health state, unspecified: Secondary | ICD-10-CM | POA: Diagnosis not present

## 2023-01-01 DIAGNOSIS — Z419 Encounter for procedure for purposes other than remedying health state, unspecified: Secondary | ICD-10-CM | POA: Diagnosis not present

## 2023-01-31 DIAGNOSIS — Z419 Encounter for procedure for purposes other than remedying health state, unspecified: Secondary | ICD-10-CM | POA: Diagnosis not present

## 2023-03-03 DIAGNOSIS — Z419 Encounter for procedure for purposes other than remedying health state, unspecified: Secondary | ICD-10-CM | POA: Diagnosis not present

## 2023-04-02 DIAGNOSIS — Z419 Encounter for procedure for purposes other than remedying health state, unspecified: Secondary | ICD-10-CM | POA: Diagnosis not present

## 2023-05-03 DIAGNOSIS — Z419 Encounter for procedure for purposes other than remedying health state, unspecified: Secondary | ICD-10-CM | POA: Diagnosis not present

## 2023-06-03 DIAGNOSIS — Z419 Encounter for procedure for purposes other than remedying health state, unspecified: Secondary | ICD-10-CM | POA: Diagnosis not present

## 2023-07-01 DIAGNOSIS — Z419 Encounter for procedure for purposes other than remedying health state, unspecified: Secondary | ICD-10-CM | POA: Diagnosis not present

## 2023-08-12 DIAGNOSIS — Z419 Encounter for procedure for purposes other than remedying health state, unspecified: Secondary | ICD-10-CM | POA: Diagnosis not present

## 2023-09-11 DIAGNOSIS — Z419 Encounter for procedure for purposes other than remedying health state, unspecified: Secondary | ICD-10-CM | POA: Diagnosis not present

## 2023-10-12 DIAGNOSIS — Z419 Encounter for procedure for purposes other than remedying health state, unspecified: Secondary | ICD-10-CM | POA: Diagnosis not present

## 2023-11-11 DIAGNOSIS — Z419 Encounter for procedure for purposes other than remedying health state, unspecified: Secondary | ICD-10-CM | POA: Diagnosis not present

## 2023-12-12 DIAGNOSIS — Z419 Encounter for procedure for purposes other than remedying health state, unspecified: Secondary | ICD-10-CM | POA: Diagnosis not present

## 2024-01-12 DIAGNOSIS — Z419 Encounter for procedure for purposes other than remedying health state, unspecified: Secondary | ICD-10-CM | POA: Diagnosis not present

## 2024-04-12 DIAGNOSIS — Z419 Encounter for procedure for purposes other than remedying health state, unspecified: Secondary | ICD-10-CM | POA: Diagnosis not present
